# Patient Record
Sex: Female | Born: 1965 | Hispanic: Refuse to answer | Marital: Single
Health system: Midwestern US, Community
[De-identification: ages and names within clinical notes are randomized; demographics above are authoritative.]

---

## 2008-10-03 LAB — OB BLOOD BANK
ABO/Rh(D): A POS
ANTIBODY TITER: 1:2 {titer}
Antibody screen: POSITIVE

## 2008-10-04 LAB — CULTURE, URINE
Culture result:: NO GROWTH
Culture: NO GROWTH

## 2008-10-04 LAB — OB PROFILE 6 BSHR
ABS. EOSINOPHILS: 0 10*3/uL (ref 0.0–0.4)
ABS. LYMPHOCYTES: 2.3 10*3/uL (ref 0.8–3.5)
ABS. MONOCYTES: 0.6 10*3/uL (ref 0–1.0)
ABS. NEUTROPHILS: 4.8 10*3/uL (ref 1.8–8.0)
BASOPHILS: 0 % (ref 0–3)
EOSINOPHILS: 0 % (ref 0–5)
HCT: 33.2 % — ABNORMAL LOW (ref 36.0–46.0)
HGB: 11.2 g/dL — ABNORMAL LOW (ref 12.0–16.0)
HIV 1/2 Ab screen: NEGATIVE
Hep B surface Ag Interp.: NEGATIVE
Hepatitis B surface Ag: <0.1 Index (ref <1.00)
LYMPHOCYTES: 30 % (ref 20–51)
MCH: 30.1 PG (ref 25.0–35.0)
MCHC: 33.7 g/dL (ref 31.0–37.0)
MCV: 89.2 FL (ref 78.0–102.0)
MONOCYTES: 7 % (ref 2–9)
MPV: 7.1 FL — ABNORMAL LOW (ref 7.4–10.4)
NEUTROPHILS: 63 % (ref 42–75)
PLATELET: 359 10*3/uL (ref 130–400)
RBC: 3.72 M/uL — ABNORMAL LOW (ref 4.10–5.10)
RDW: 16.6 % — ABNORMAL HIGH (ref 11.5–14.5)
RPR: NONREACTIVE
Rubella IgG, QL: IMMUNE
WBC: 7.7 10*3/uL (ref 4.5–13.0)

## 2008-10-04 LAB — VITAMIN D, 25 HYDROXY: Vitamin D 25-Hydroxy: 54 ng/mL (ref 30–80)

## 2009-03-19 LAB — CULTURE, GENITAL GROUP B STREP: Culture result:: NEGATIVE

## 2009-04-09 LAB — CBC WITH AUTOMATED DIFF
ABS. EOSINOPHILS: 0 10*3/uL (ref 0.0–0.4)
ABS. LYMPHOCYTES: 1.7 10*3/uL (ref 0.8–3.5)
ABS. MONOCYTES: 0.7 10*3/uL (ref 0–1.0)
ABS. NEUTROPHILS: 10.9 10*3/uL — ABNORMAL HIGH (ref 1.8–8.0)
BASOPHILS: 0 % (ref 0–3)
EOSINOPHILS: 0 % (ref 0–5)
HCT: 33 % — ABNORMAL LOW (ref 36.0–46.0)
HGB: 11.2 g/dL — ABNORMAL LOW (ref 12.0–16.0)
LYMPHOCYTES: 12 % — ABNORMAL LOW (ref 20–51)
MCH: 30.5 PG (ref 25.0–35.0)
MCHC: 33.8 g/dL (ref 31.0–37.0)
MCV: 90 FL (ref 78.0–102.0)
MONOCYTES: 6 % (ref 2–9)
MPV: 7.3 FL — ABNORMAL LOW (ref 7.4–10.4)
NEUTROPHILS: 82 % — ABNORMAL HIGH (ref 42–75)
PLATELET: 280 10*3/uL (ref 130–400)
RBC: 3.67 M/uL — ABNORMAL LOW (ref 4.10–5.10)
RDW: 17 % — ABNORMAL HIGH (ref 11.5–14.5)
WBC: 13.3 10*3/uL — ABNORMAL HIGH (ref 4.5–13.0)

## 2009-04-10 LAB — CBC WITH AUTOMATED DIFF
ABS. EOSINOPHILS: 0 10*3/uL (ref 0.0–0.4)
ABS. LYMPHOCYTES: 1.3 10*3/uL (ref 0.8–3.5)
ABS. MONOCYTES: 0.6 10*3/uL (ref 0–1.0)
ABS. NEUTROPHILS: 14.1 10*3/uL — ABNORMAL HIGH (ref 1.8–8.0)
BASOPHILS: 0 % (ref 0–3)
EOSINOPHILS: 0 % (ref 0–5)
HCT: 26.2 % — ABNORMAL LOW (ref 36.0–46.0)
HGB: 8.8 g/dL — ABNORMAL LOW (ref 12.0–16.0)
LYMPHOCYTES: 8 % — ABNORMAL LOW (ref 20–51)
MCH: 30.3 PG (ref 25.0–35.0)
MCHC: 33.7 g/dL (ref 31.0–37.0)
MCV: 90.1 FL (ref 78.0–102.0)
MONOCYTES: 4 % (ref 2–9)
MPV: 7.3 FL — ABNORMAL LOW (ref 7.4–10.4)
NEUTROPHILS: 88 % — ABNORMAL HIGH (ref 42–75)
PLATELET: 238 10*3/uL (ref 130–400)
RBC: 2.91 M/uL — ABNORMAL LOW (ref 4.10–5.10)
RDW: 16.7 % — ABNORMAL HIGH (ref 11.5–14.5)
WBC: 16 10*3/uL — ABNORMAL HIGH (ref 4.5–13.0)

## 2009-04-13 LAB — TYPE & SCREEN
ABO/Rh(D): A POS
ANTIGEN/ANTIBODY INFO: NEGATIVE
ANTIGEN/ANTIBODY INFO: NEGATIVE
Antibody screen: POSITIVE

## 2009-04-13 LAB — TYPE AND SCREEN
ABO/Rh: A POS
Antibody Screen: POSITIVE
Antigen/Antibody: NEGATIVE
Antigen/Antibody: NEGATIVE

## 2009-05-30 LAB — CHLAMYDIA/GC PCR
Chlamydia amplified: NEGATIVE
N. gonorrhea, amplified: NEGATIVE

## 2009-06-15 NOTE — Discharge Summary (Unsigned)
[facility]   [address], [place] [address]     DISCHARGE SUMMARY    PATIENT: Sandra Mays, Sandra Mays  MRN: [id number] ADMITTED: 04/09/2009  BILLING: [id number] DISCHARGED: 04/12/2009  ATTENDING: Karolee Stamps, MD  DICTATING: Karolee Stamps, MD    ADMITTING DIAGNOSIS: Previous cesarean section, at term, in labor.    HISTORY: This is a 43 year old female, gravida 4, para 1-0-2-1 at 39-1/2  weeks', [facility] 04/15/2009, who was admitted by Dr. Kathlene November for early labor. The  patient and Dr. Kathlene November had decided that she would undergo a trial of  vaginal birth after cesarean section. Her physical exam on admission was  significant for the fact that she was 4 cm dilated, 70% effaced with the  fetal head at 0 station. Fetal heart tones were in the 140s. (For details  of the physical exam, please refer to the history and physical in the  chart.) Her prenatal course up to that point had been uneventful. She was  admitted on 04/09/09, in active labor.    HOSPITAL COURSE: The patient continued to labor over the next 10 hours  with Pitocin augmentation and artificial rupture of membranes. She was  given an epidural. At 2135, she started having some repetitive late  decelerations. Dr. Kathlene November contended at that time that the patient's  nonreassuring fetal heart rate warranted a repeat cesarean section. She was  taken to surgery on 04/09/09, and underwent a repeat cesarean section for a  viable female infant with Apgars of 8 and 9. Postoperatively, she did very  well. She did not incur any significant complication. She was mildly anemic  with hemoglobin and hematocrit 8.8 and 26.2. She was placed on iron. She  was discharged home in stable condition on 04/12/09.    DISCHARGE ORDERS  1. Regular diet.  2. Activity: No sex, douching, tampons, strenuous activity for 4 weeks.    MEDICATIONS  1. Iron sulfate twice a day.  2. Percocet 5/325 two tablets every 4 to 6 hours p.r.n.     FOLLOWUP: Supposed to be with Dr. Kathlene November in 1 week for incision check.    FINAL DIAGNOSES IN THIS PATIENT  1. Non-reassuring fetal heart rate at term.  2. Failed vaginal birth after cesarean section.         Preliminary   Karolee Stamps, MD    EWL:wmx  D: 06/15/2009 2:50 P T: 06/15/2009 8:42 P  Job #: [id number] CScriptDoc #: [id number]  cc: Karolee Stamps, MD

## 2010-08-12 NOTE — Progress Notes (Signed)
Subjective:   44 y.o. female for annual routine Pap and checkup.  Patient's last menstrual period was 07/26/2010.    Social History: single partner, contraception - none.  Pertinent past medical hstory: no history of HTN, DVT, CAD, DM, liver disease, migraines or smoking.    There is no problem list on file for this patient.       ROS:  Feeling well. No dyspnea or chest pain on exertion.  No abdominal pain, change in bowel habits, black or bloody stools.  No urinary tract symptoms. GYN ROS: normal menses, no abnormal bleeding, pelvic pain or discharge, no breast pain or new or enlarging lumps on self exam, she complains of mild discomfort with her last cycle. No neurological complaints.    She also had a +UPT and missed menses.  She has since had a cycle and desires a pregnancy.  Objective:   BP 103/67   Pulse 87   Resp 18   Ht 5\' 5"  (1.651 m)   Wt 126 lb (57.153 kg)   BMI 20.97 kg/m2   LMP 07/26/2010   Breastfeeding? No  The patient appears well, alert, oriented x 3, in no distress.  ENT normal.  Neck supple. No adenopathy or thyromegaly. PERLA. Lungs are clear, good air entry, no wheezes, rhonchi or rales. S1 and S2 normal, no murmurs, regular rate and rhythm. Abdomen soft without tenderness, guarding, mass or organomegaly. Extremities show no edema, normal peripheral pulses. Neurological is normal, no focal findings.    BREAST EXAM: breasts appear normal, no suspicious masses, no skin or nipple changes or axillary nodes, bilateral implants, no palpable abnormalities otherwise    PELVIC EXAM: normal external genitalia, vulva, vagina, cervix, uterus and adnexa, VULVA: normal appearing vulva with no masses, tenderness or lesions, VAGINA: normal appearing vagina with normal color and discharge, no lesions, CERVIX: normal appearing cervix without discharge or lesions, UTERUS: uterus is normal size, shape, consistency and nontender, ADNEXA: normal adnexa in size, nontender and no masses    Assessment/Plan:    well woman  Mammogram scheduled today.  pap smear  counseled on breast self exam, mammography screening, family planning choices and adequate intake of calcium and vitamin D  return annually or prn  1. Routine gynecological examination (V72.31)  PAP, IG, RFX HPV ASCUS ([id number])     .

## 2010-08-13 NOTE — Progress Notes (Addendum)
Addended by: GILLIS, TYESHA S on: 08/13/2010      Modules accepted: Level of Service

## 2010-09-03 NOTE — Progress Notes (Signed)
Quick Note:    Patient needs Pap in 1 year.  ______

## 2013-01-10 IMAGING — DX FOOT 3 VIEWS LEFT
1 series · 3 of 3 positions shown · non-contrast
Comparison: none

LEFT FOOT, 01/10/13:
Three views of the left foot from 01/10/13 were obtained in this 47-year-
old with pain in the MP joints.
Bone density is normal.  There is no fracture or dislocation. The joint
spaces are well preserved.

[Series 1: AP · U · 0.14mm/px · 3 of 3 slices shown]
[im 1/3]
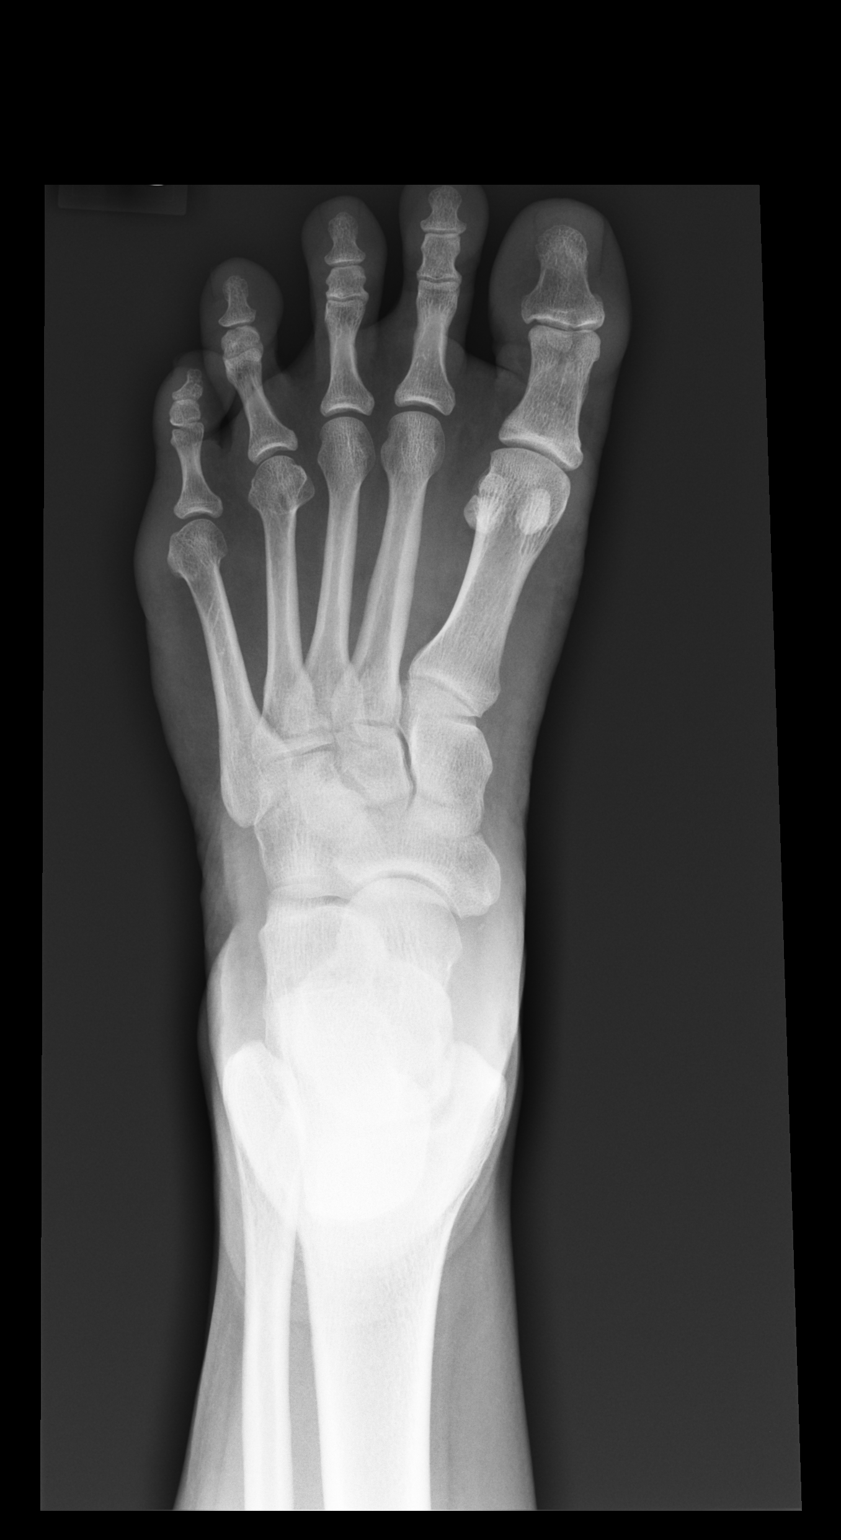
[im 2/3]
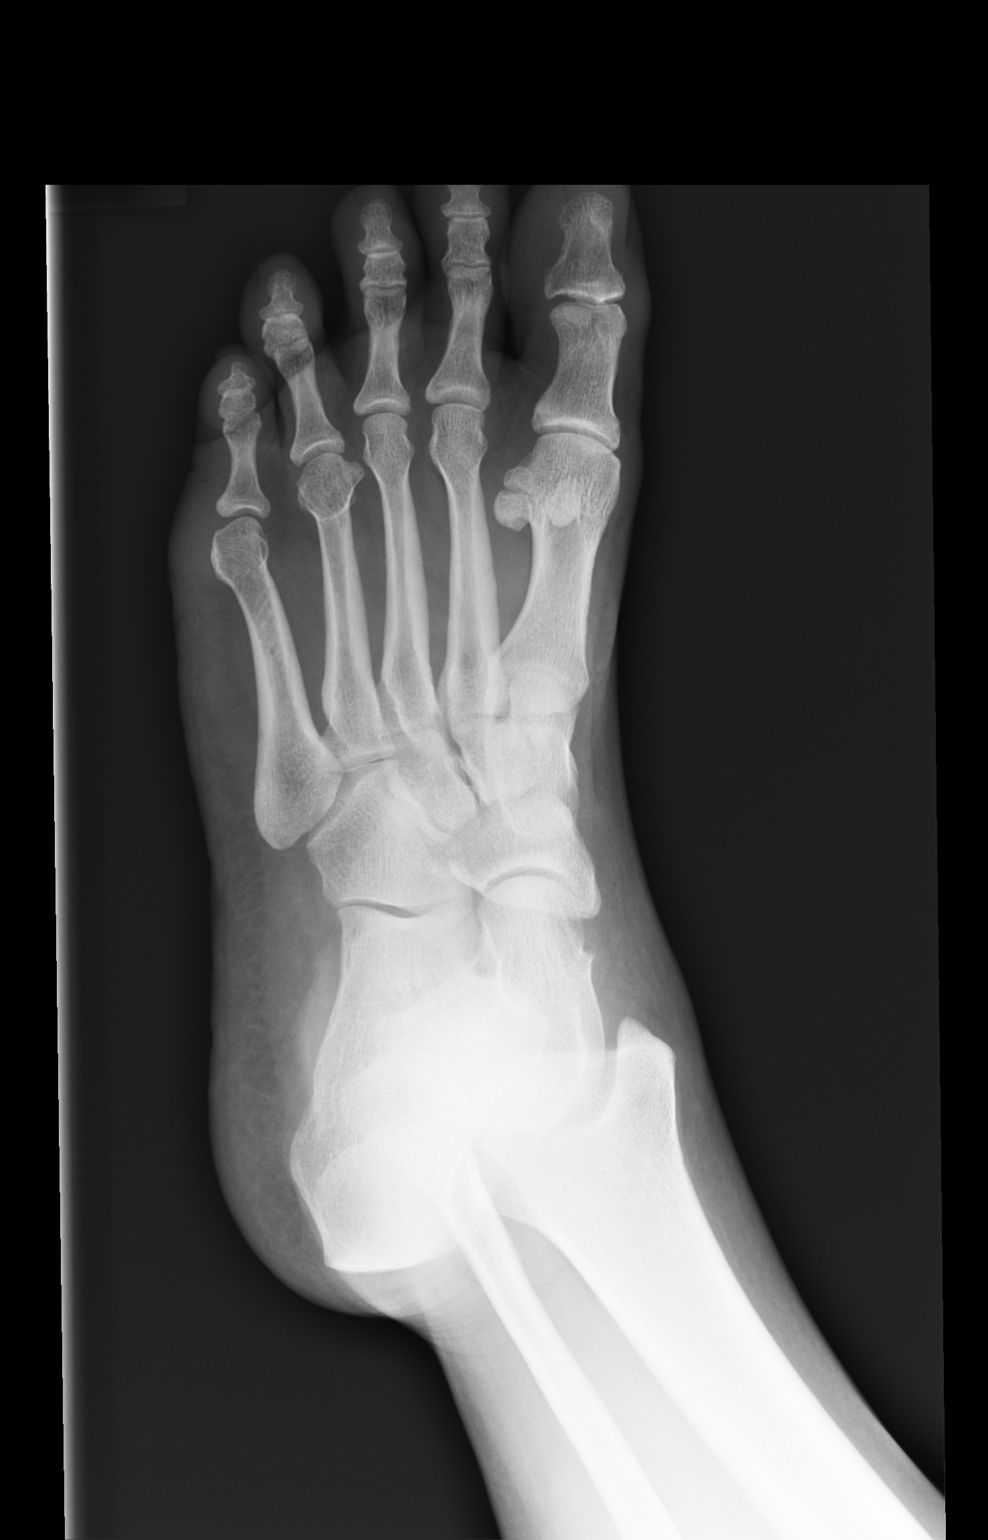
[im 3/3]
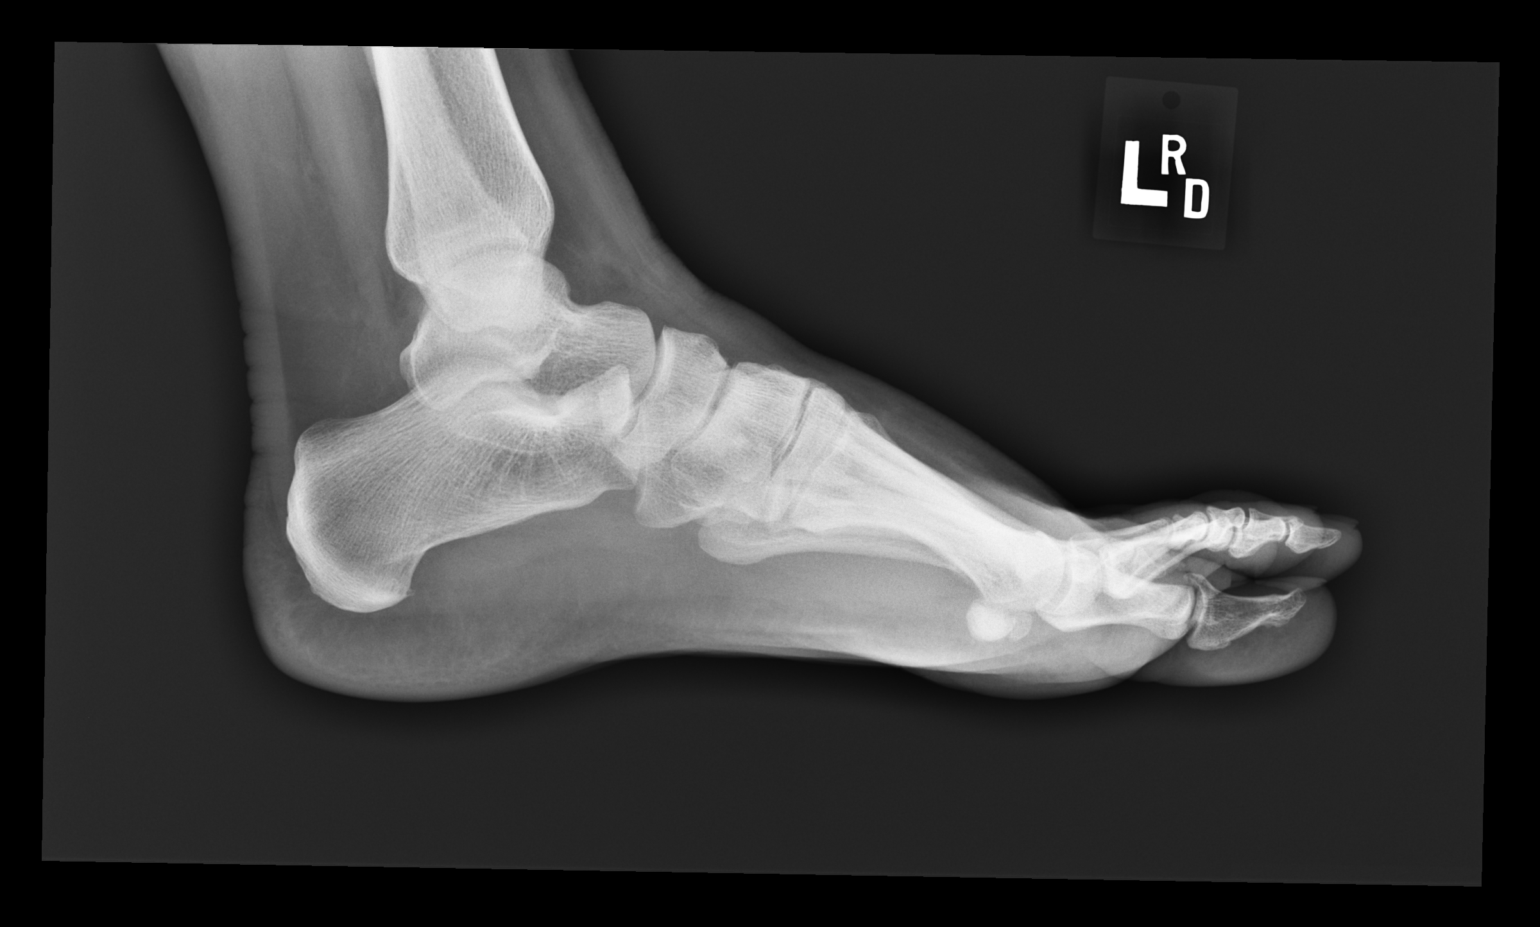

[3 of 3 positions shown; findings below may reference images not displayed]

IMPRESSION: Negative left foot films.

## 2015-01-02 ENCOUNTER — Ambulatory Visit
Admit: 2015-01-02 | Discharge: 2015-01-02 | Payer: PRIVATE HEALTH INSURANCE | Attending: Obstetrics & Gynecology | Primary: Internal Medicine

## 2015-01-02 ENCOUNTER — Inpatient Hospital Stay: Admit: 2015-01-02 | Payer: BLUE CROSS/BLUE SHIELD | Primary: Internal Medicine

## 2015-01-02 DIAGNOSIS — Z01419 Encounter for gynecological examination (general) (routine) without abnormal findings: Secondary | ICD-10-CM

## 2015-01-02 LAB — AMB POC URINE PREGNANCY TEST, VISUAL COLOR COMPARISON: HCG urine, Ql. (POC): NEGATIVE

## 2015-01-02 NOTE — Progress Notes (Signed)
Subjective:   49 y.o. female for annual routine Pap and checkup. She also c/o bilateral upper abdominal pain that is often sharp, pinpoint and not associated with excessive exercise, menses, dyspareunia, or any other GYN symptom. She is concerned it may be scar tissue from her previous c-section.  Patient's last menstrual period was 11/30/2014 (exact date).    Social History: single partner, contraception - none.  Pertinent past medical hstory: no history of HTN, DVT, CAD, DM, liver disease, migraines or smoking.    There is no problem list on file for this patient.      No Known Allergies  Past Medical History   Diagnosis Date   ??? AMA (advanced maternal age) multigravida 30+    ??? Fibroids      Past Surgical History   Procedure Laterality Date   ??? Hx gyn  04/09/2009     C-section     History   Substance Use Topics   ??? Smoking status: Never Smoker    ??? Smokeless tobacco: Never Used   ??? Alcohol Use: Yes      Comment: social        ROS:  Feeling well. No dyspnea or chest pain on exertion.  No abdominal pain, change in bowel habits, black or bloody stools.  No urinary tract symptoms. GYN ROS: normal menses, no abnormal bleeding, pelvic pain or discharge, no breast pain or new or enlarging lumps on self exam. No neurological complaints.    Objective:   BP 109/78 mmHg   Pulse 85   Ht 5\' 5"  (1.651 m)   Wt 140 lb (63.504 kg)   BMI 23.30 kg/m2   LMP 11/30/2014 (Exact Date)  The patient appears well, alert, oriented x 3, in no distress.  ENT normal.  Neck supple. No adenopathy or thyromegaly. PERLA. Lungs are clear, good air entry, no wheezes, rhonchi or rales. S1 and S2 normal, no murmurs, regular rate and rhythm. Abdomen soft without tenderness, guarding, mass or organomegaly. Extremities show no edema, normal peripheral pulses. Neurological is normal, no focal findings.    BREAST EXAM: bilateral implants, no palpable abnormalities otherwise    PELVIC EXAM: normal external genitalia, vulva, vagina, cervix, uterus and  adnexa    Assessment/Plan:   well woman with upper abdominal pain, not seemingly GYN in nature.  Recommend work-up with PCP if continues.  Pt. Has concern for previously diagnosed fibroids- will be reassessed with ultrasound.  mammogram  pap smear  return annually or prn    ICD-10-CM ICD-9-CM    1. Well woman exam with routine gynecological exam Z01.419 V72.31 PAP IG, RFX HPV ASCU, 10&17,51([id number])   2. Visit for screening mammogram Z12.31 V76.12 MAM MAMMO BI SCREENING DIGTL   3. Menorrhagia with irregular cycle N92.1 626.2 [place] PELV NON OB W TV      AMB POC URINE PREGNANCY TEST, VISUAL COLOR COMPARISON   4. Encounter to establish care Z71.89 V65.8 REFERRAL TO INTERNAL MEDICINE   The plan of care has been discussed with the patient.  She has been given the opportunity to ask questions, which seem to have been answered satisfactorily.

## 2015-01-02 NOTE — Patient Instructions (Signed)
Abdominal Pain: Care Instructions  Your Care Instructions     Abdominal pain has many possible causes. Some aren't serious and get better on their own in a few days. Others need more testing and treatment. If your pain continues or gets worse, you need to be rechecked and may need more tests to find out what is wrong. You may need surgery to correct the problem.  Don't ignore new symptoms, such as fever, nausea and vomiting, urination problems, pain that gets worse, and dizziness. These may be signs of a more serious problem.  Your doctor may have recommended a follow-up visit in the next 8 to 12 hours. If you are not getting better, you may need more tests or treatment.  The doctor has checked you carefully, but problems can develop later. If you notice any problems or new symptoms, get medical treatment right away.  Follow-up care is a key part of your treatment and safety. Be sure to make and go to all appointments, and call your doctor if you are having problems. It's also a good idea to know your test results and keep a list of the medicines you take.  How can you care for yourself at home?  ?? Rest until you feel better.  ?? To prevent dehydration, drink plenty of fluids, enough so that your urine is light yellow or clear like water. Choose water and other caffeine-free clear liquids until you feel better. If you have kidney, heart, or liver disease and have to limit fluids, talk with your doctor before you increase the amount of fluids you drink.  ?? If your stomach is upset, eat mild foods, such as rice, dry toast or crackers, bananas, and applesauce. Try eating several small meals instead of two or three large ones.  ?? Wait until 48 hours after all symptoms have gone away before you have spicy foods, alcohol, and drinks that contain caffeine.  ?? Do not eat foods that are high in fat.  ?? Avoid anti-inflammatory medicines such as aspirin, ibuprofen (Advil,  Motrin), and naproxen (Aleve). These can cause stomach upset. Talk to your doctor if you take daily aspirin for another health problem.  When should you call for help?  Call 911 anytime you think you may need emergency care. For example, call if:  ?? You passed out (lost consciousness).  ?? You pass maroon or very bloody stools.  ?? You vomit blood or what looks like coffee grounds.  ?? You have new, severe belly pain.  Call your doctor now or seek immediate medical care if:  ?? Your pain gets worse, especially if it becomes focused in one area of your belly.  ?? You have a new or higher fever.  ?? Your stools are black and look like tar, or they have streaks of blood.  ?? You have unexpected vaginal bleeding.  ?? You have symptoms of a urinary tract infection. These may include:  ?? Pain when you urinate.  ?? Urinating more often than usual.  ?? Blood in your urine.  ?? You are dizzy or lightheaded, or you feel like you may faint.  Watch closely for changes in your health, and be sure to contact your doctor if:  ?? You are not getting better after 1 day (24 hours).   Where can you learn more?   Go to http://www.healthwise.net/[facility]  Enter E907 in the search box to learn more about "Abdominal Pain: Care Instructions."   ?? 2006-2015 Healthwise, Incorporated. Care instructions adapted under   license by [place] (which disclaims liability or warranty for this information). This care instruction is for use with your licensed healthcare professional. If you have questions about a medical condition or this instruction, always ask your healthcare professional. Healthwise, Incorporated disclaims any warranty or liability for your use of this information.  Content Version: 10.7.482551; Current as of: Feb 17, 2014

## 2015-01-02 NOTE — Progress Notes (Signed)
p-test = negative

## 2015-01-04 NOTE — Progress Notes (Signed)
Quick Note:        Normal pap.    Letter please    ______

## 2015-01-08 NOTE — Progress Notes (Signed)
Pap letter sent.

## 2015-01-16 ENCOUNTER — Inpatient Hospital Stay: Admit: 2015-01-16 | Payer: BLUE CROSS/BLUE SHIELD | Attending: Obstetrics & Gynecology | Primary: Internal Medicine

## 2015-01-16 DIAGNOSIS — N921 Excessive and frequent menstruation with irregular cycle: Secondary | ICD-10-CM

## 2015-01-16 DIAGNOSIS — Z1231 Encounter for screening mammogram for malignant neoplasm of breast: Secondary | ICD-10-CM

## 2015-01-18 ENCOUNTER — Inpatient Hospital Stay: Admit: 2015-01-18 | Payer: BLUE CROSS/BLUE SHIELD | Primary: Internal Medicine

## 2015-01-18 ENCOUNTER — Ambulatory Visit
Admit: 2015-01-18 | Discharge: 2015-01-18 | Payer: PRIVATE HEALTH INSURANCE | Attending: Internal Medicine | Primary: Internal Medicine

## 2015-01-18 DIAGNOSIS — R1012 Left upper quadrant pain: Secondary | ICD-10-CM

## 2015-01-18 DIAGNOSIS — R140 Abdominal distension (gaseous): Secondary | ICD-10-CM

## 2015-01-18 LAB — METABOLIC PANEL, COMPREHENSIVE
A-G Ratio: 1.1 (ref 0.8–1.7)
ALT (SGPT): 34 U/L (ref 13–56)
AST (SGOT): 23 U/L (ref 15–37)
Albumin: 4.2 g/dL (ref 3.4–5.0)
Alk. phosphatase: 45 U/L (ref 45–117)
Anion gap: 11 mmol/L (ref 3.0–18)
BUN/Creatinine ratio: 10 — ABNORMAL LOW (ref 12–20)
BUN: 7 MG/DL (ref 7.0–18)
Bilirubin, total: 0.3 MG/DL (ref 0.2–1.0)
CO2: 26 mmol/L (ref 21–32)
Calcium: 8.5 MG/DL (ref 8.5–10.1)
Chloride: 104 mmol/L (ref 100–108)
Creatinine: 0.71 MG/DL (ref 0.6–1.3)
GFR est AA: >60 mL/min/{1.73_m2} (ref >60)
GFR est non-AA: >60 mL/min/{1.73_m2} (ref >60)
Globulin: 3.8 g/dL (ref 2.0–4.0)
Glucose: 87 mg/dL (ref 74–99)
Potassium: 4.3 mmol/L (ref 3.5–5.5)
Protein, total: 8 g/dL (ref 6.4–8.2)
Sodium: 141 mmol/L (ref 136–145)

## 2015-01-18 LAB — CBC W/O DIFF
HCT: 24.4 % — ABNORMAL LOW (ref 35.0–45.0)
HGB: 6.8 g/dL — ABNORMAL LOW (ref 12.0–16.0)
MCH: 19.2 PG — ABNORMAL LOW (ref 24.0–34.0)
MCHC: 27.9 g/dL — ABNORMAL LOW (ref 31.0–37.0)
MCV: 68.7 FL — ABNORMAL LOW (ref 74.0–97.0)
MPV: 9.1 FL — ABNORMAL LOW (ref 9.2–11.8)
PLATELET: 395 10*3/uL (ref 135–420)
RBC: 3.55 M/uL — ABNORMAL LOW (ref 4.20–5.30)
RDW: 20.7 % — ABNORMAL HIGH (ref 11.6–14.5)
WBC: 4.8 10*3/uL (ref 4.6–13.2)

## 2015-01-18 LAB — LIPID PANEL
CHOL/HDL Ratio: 3.6 (ref 0–5.0)
Cholesterol, total: 196 MG/DL (ref <200)
HDL Cholesterol: 55 MG/DL (ref 40–60)
LDL, calculated: 124.8 MG/DL — ABNORMAL HIGH (ref 0–100)
Triglyceride: 81 MG/DL (ref <150)
VLDL, calculated: 16.2 MG/DL

## 2015-01-18 LAB — IRON PROFILE
Iron % saturation: 3 %
Iron: 16 ug/dL — ABNORMAL LOW (ref 50–175)
TIBC: 533 ug/dL — ABNORMAL HIGH (ref 250–450)

## 2015-01-18 LAB — FERRITIN: Ferritin: 3 NG/ML — ABNORMAL LOW (ref 8–388)

## 2015-01-18 NOTE — Progress Notes (Signed)
Sandra Mays is a 49 y.o. female  Patient here to establish care. Patient with concerns with abdominal bloating.          1. Have you been to the ER, urgent care clinic since your last visit?  Hospitalized since your last visit?No    2. Have you seen or consulted any other health care providers outside of the [place] since your last visit?  Include any pap smears or colon screening. No

## 2015-01-18 NOTE — Progress Notes (Signed)
Assessment/Plan:    Adaly was seen today for establish care.    Diagnoses and all orders for this visit:    LUQ pain and abd bloating- discussed need for GI eval.  It most likely is related to GI, although doesn't have clear association with eating or with bm.  If GI w/u is neg, will get CT abd/pelvis.  Orders:  -     METABOLIC PANEL, COMPREHENSIVE; Future  -     TISSUE TRANSGLUT. AB, IGG; Future    Anemia, unspecified type- likely from menorrhagia/fibroids.  Orders:  -     CBC W/O DIFF; Future  -     IRON PROFILE; Future  -     FERRITIN; Future    Screening for cardiovascular condition  Orders:  -     LIPID PANEL; Future        The plan was discussed with the patient.  The patient verbalized understanding and is in agreement with the plan.  All medication potential side effects were discussed with the patient.  Health Maintenance:   Health Maintenance   Topic Date Due   ??? Tdap Age > 18  12/11/1984   ??? Td Q 10 Yrs Age > 18  12/11/1984   ??? INFLUENZA AGE 19 TO ADULT  04/29/2014   ??? PAP AKA CERVICAL CYTOLOGY  01/01/2018       Chiquita Heckert is a 49 y.o.  female and presents with Establish Care     Subjective:  Pt is here to establish care.  Pt notes some RUQ pain that started several years ago.  It moved to LUQ pain.  States it's tender to touch.  Notes increased bloating.  Had only one episode of vomiting that happened 1-2 mos ago.  This was associated with diarrhea.  This lasted a week.  But notes her abd pain resolved after this episode b/c she was "cleared out".  The pain is only when she presses on her abdomen.  Not associated with eating. Notes a change in her bm over the past month - currently her bm are one piece, well formed, every other day, easy to pass.  Can't tell me if pain improves after a bm.  Has been tested for celiac and lactose allergy in the past, and it was neg.  Doesn't relate any issues with dietary triggers like gluten.    Notes a h/o anemia.  Has h/o fibroids.  Has had syncopal episodes that she  attributes to anemia.  Notes DOE from anemia.    ROS:  Constitutional: No recent weight change. No weakness/fatigue.  No f/c.   Skin: No rashes, change in nails/hair, itching   HENT: No HA, dizziness. No hearing loss/tinnitus.  No nasal congestion/discharge.   Eyes: No change in vision, double/blurred vision or eye pain/redness.    Cardiovascular: No CP/palpitations.  No DOE/orthopnea/PND.   Respiratory: No cough/sputum, dyspnea, wheezing.   Gastointestinal: No dysphagia, reflux.  No n/v.  No constipation/diarrhea.  No melena/rectal bleeding.   Genitourinary: No dysuria, urinary hesitancy, nocturia, hematuria.  No incontinence.   Musculoskeletal: No joint pain/stiffness.  No muscle pain/tenderness.   Endo: No heat/cold intolerance, no polyuria/polydypsia.   Heme: + h/o anemia.  No easy bleeding/bruising.   Allergy/Immunology: No seasonal rhinitis. Denies frequent colds, sinus/ear infections.   Neurological: No seizures/numbness/weakness.  No paresthesias.   Psychiatric:  No depression, anxiety.   PMH:  Past Medical History   Diagnosis Date   ??? AMA (advanced maternal age) multigravida 61+    ???  Fibroids        PSH:  Past Surgical History   Procedure Laterality Date   ??? Hx gyn  04/09/2009     C-section   ??? Implant breast silicone/eq Bilateral      saline implants-2004        SH:  History   Substance Use Topics   ??? Smoking status: Never Smoker    ??? Smokeless tobacco: Never Used   ??? Alcohol Use: Yes      Comment: social       FH:  Family History   Problem Relation Age of Onset   ??? Diabetes Mother 35   ??? Diabetes Father 60   ??? Diabetes Sister    ??? Cataract Sister    ??? Cancer Paternal Aunt 42     Breast   ??? Breast Cancer Paternal Aunt 41     Medications/Allergies:  No current outpatient prescriptions on file.  No Known Allergies    Objective:  BP 100/62 mmHg   Pulse 81   Temp(Src) 98.2 ??F (36.8 ??C) (Oral)   Resp 20   Ht 5' 4.5" (1.638 m)   Wt 139 lb (63.05 kg)   BMI 23.50 kg/m2   SpO2 99%   LMP 01/07/2015    Constitutional: Well developed, nourished, no distress, alert   HENT: Exterior ears and tympanic membranes normal bilaterally. Supple neck. No thyromegaly or lymphadenopathy. Oropharynx clear and moist mucous membranes.     Eyes: Conjunctiva normal. PERRL.    Cardiovascular: S1, S2.  RRR.  No murmurs/rubs.  No thrills palpated.  No carotid bruits.  Intact distal pulses.  No edema.   Pulmonary/Chest Wall: No abnormalities on inspection.  Clear to auscultation bilaterally. No wheezing/rhonchi.  Normal effort.     GI: Soft, diffuse mild tenderness to palpation, no rebound or guarding, nondistended.  Normal active bowel sounds. No  masses on palpation.  No hepatosplenomegaly.   Musculoskeletal: Gait normal.  Joints without deformity/tenderness.     Neurological: Appropriate.  No focal motor or sensory deficits. Speech normal.   Psych: Appropriate affect, judgement and insight.  Short-term memory intact.

## 2015-01-18 NOTE — Patient Instructions (Signed)
Abdominal Pain: Care Instructions  Your Care Instructions     Abdominal pain has many possible causes. Some aren't serious and get better on their own in a few days. Others need more testing and treatment. If your pain continues or gets worse, you need to be rechecked and may need more tests to find out what is wrong. You may need surgery to correct the problem.  Don't ignore new symptoms, such as fever, nausea and vomiting, urination problems, pain that gets worse, and dizziness. These may be signs of a more serious problem.  Your doctor may have recommended a follow-up visit in the next 8 to 12 hours. If you are not getting better, you may need more tests or treatment.  The doctor has checked you carefully, but problems can develop later. If you notice any problems or new symptoms, get medical treatment right away.  Follow-up care is a key part of your treatment and safety. Be sure to make and go to all appointments, and call your doctor if you are having problems. It's also a good idea to know your test results and keep a list of the medicines you take.  How can you care for yourself at home?  ?? Rest until you feel better.  ?? To prevent dehydration, drink plenty of fluids, enough so that your urine is light yellow or clear like water. Choose water and other caffeine-free clear liquids until you feel better. If you have kidney, heart, or liver disease and have to limit fluids, talk with your doctor before you increase the amount of fluids you drink.  ?? If your stomach is upset, eat mild foods, such as rice, dry toast or crackers, bananas, and applesauce. Try eating several small meals instead of two or three large ones.  ?? Wait until 48 hours after all symptoms have gone away before you have spicy foods, alcohol, and drinks that contain caffeine.  ?? Do not eat foods that are high in fat.  ?? Avoid anti-inflammatory medicines such as aspirin, ibuprofen (Advil,  Motrin), and naproxen (Aleve). These can cause stomach upset. Talk to your doctor if you take daily aspirin for another health problem.  When should you call for help?  Call 911 anytime you think you may need emergency care. For example, call if:  ?? You passed out (lost consciousness).  ?? You pass maroon or very bloody stools.  ?? You vomit blood or what looks like coffee grounds.  ?? You have new, severe belly pain.  Call your doctor now or seek immediate medical care if:  ?? Your pain gets worse, especially if it becomes focused in one area of your belly.  ?? You have a new or higher fever.  ?? Your stools are black and look like tar, or they have streaks of blood.  ?? You have unexpected vaginal bleeding.  ?? You have symptoms of a urinary tract infection. These may include:  ?? Pain when you urinate.  ?? Urinating more often than usual.  ?? Blood in your urine.  ?? You are dizzy or lightheaded, or you feel like you may faint.  Watch closely for changes in your health, and be sure to contact your doctor if:  ?? You are not getting better after 1 day (24 hours).   Where can you learn more?   Go to http://www.healthwise.net/[facility]  Enter E907 in the search box to learn more about "Abdominal Pain: Care Instructions."   ?? 2006-2016 Healthwise, Incorporated. Care instructions adapted under   license by [place] (which disclaims liability or warranty for this information). This care instruction is for use with your licensed healthcare professional. If you have questions about a medical condition or this instruction, always ask your healthcare professional. Healthwise, Incorporated disclaims any warranty or liability for your use of this information.  Content Version: 10.8.513193; Current as of: Feb 17, 2014

## 2015-01-21 LAB — TISSUE TRANSGLUT. AB, IGG: t-Transglutaminase, IgG: 2 U/mL (ref 0–5)

## 2015-01-22 NOTE — Progress Notes (Signed)
Quick Note:        Tell pt her labs show that she has low iron and anemia (low blood count). I want her to start taking an OTC iron supplement twice daily. She should take colace (docusate) with it b/c it can cause constipation. I'd like to recheck her labs in 2-3 months.    ______

## 2015-01-23 ENCOUNTER — Ambulatory Visit: Admit: 2015-01-23 | Discharge: 2015-01-23 | Attending: Obstetrics & Gynecology | Primary: Internal Medicine

## 2015-01-23 DIAGNOSIS — D251 Intramural leiomyoma of uterus: Secondary | ICD-10-CM

## 2015-01-23 NOTE — Patient Instructions (Addendum)
Uterine Fibroids: Care Instructions  Your Care Instructions     Uterine fibroids are growths in the uterus. Fibroids aren't cancer. Doctors don't know what causes fibroids. Fibroids are very common in women during their childbearing years.  Fibroids can grow on the inside of the uterus, in the muscle wall of the uterus, or near the outside wall of the uterus. In some women, fibroids cause painful cramps and heavy periods. In these cases, taking anti-inflammatory medicines, birth control pills, or using an intrauterine device (IUD) often helps decrease symptoms. Sometimes surgery is needed to treat fibroids. But if you are near menopause, you may want to wait and see if your symptoms get better.  Most fibroids shrink and go away after menopause, when your menstrual periods stop completely.  Follow-up care is a key part of your treatment and safety. Be sure to make and go to all appointments, and call your doctor if you are having problems. It's also a good idea to know your test results and keep a list of the medicines you take.  How can you care for yourself at home?  ?? If your doctor gave you medicine, take it as exactly as prescribed. Call your doctor if you think you are having a problem with your medicine.  ?? Take anti-inflammatory medicines for pain. These include ibuprofen (Advil, Motrin) and naproxen (Aleve). Read and follow all instructions on the label.  ?? Use heat, such as a hot water bottle or a heating pad set on low, or a warm bath to relax tense muscles and relieve cramping. Put a thin cloth between the heating pad and your skin. Never go to sleep with a heating pad on.  ?? Lie down and put a pillow under your knees. Or, lie on your side and bring your knees up to your chest. These positions may help relieve belly pain or pressure.  ?? Keep track of how many sanitary pads or tampons you use each day.  ?? Get at least 30 minutes of exercise on most days of the week. Walking is  a good choice. You also may want to do other activities, such as running, swimming, cycling, or playing tennis or team sports.  ?? If you bleed longer than usual or have heavy bleeding, take a daily multivitamin with iron.  When should you call for help?  Call 911 anytime you think you may need emergency care. For example, call if:  ?? You passed out (lost consciousness).  ?? You have sudden, severe pain in your belly or pelvis.  Call your doctor now or seek immediate medical care if:  ?? You have severe vaginal bleeding. This means that you are soaking through your usual pads each hour for 2 or more hours.  ?? You have new belly or pelvic pain.  ?? You are dizzy or lightheaded, or you feel like you may faint.  ?? You have new or unexpected vaginal bleeding.  Watch closely for changes in your health, and be sure to contact your doctor if:  ?? You have new vaginal discharge.  ?? You have ongoing heavy or irregular vaginal bleeding.  ?? You have pelvic pain or a heavy feeling in your lower belly that doesn't go away.  ?? You have to urinate often.  ?? You are more constipated than usual.   Where can you learn more?   Go to [email or web address]  Enter B121 in the search box to learn more about "Uterine Fibroids: Care Instructions."   ?? 2006-2016  [employer]. Care instructions adapted under license by [employer] (which disclaims liability or warranty for this information). This care instruction is for use with your licensed healthcare professional. If you have questions about a medical condition or this instruction, always ask your healthcare professional. [place] any warranty or liability for your use of this information.  Content Version: 10.8.513193; Current as of: April 26, 2014        Endometrial Ablation: Before Your Procedure  What is endometrial ablation?     Endometrial ablation is a type of procedure that's often used to treat  heavy menstrual bleeding. It can also be used for other types of bleeding in the uterus. It's not recommended if you plan to get pregnant.  Ablation works by destroying the lining of your uterus. As it heals, the lining will scar. This scarring reduces or prevents bleeding.  Your doctor may give you medicine to help you relax. You may also get medicine to help with pain. First, your doctor inserts a special tool into your vagina. This is called a speculum. It gently spreads apart the sides of your vagina. Next, the doctor may put a lighted tube through your cervix. This is called a hysteroscope or scope. It helps the doctor see inside your uterus. Then the doctor inserts a device to destroy the lining. This device may work in one of many ways. It may use a laser beam, heat, electricity, freezing, or microwaves.  Ablation can be done in a doctor's office. Or it may be done in a hospital. It usually takes less than an hour. You can go home after the procedure.  Follow-up care is a key part of your treatment and safety. Be sure to make and go to all appointments, and call your doctor if you are having problems. It's also a good idea to know your test results and keep a list of the medicines you take.  What happens before the procedure?  Procedures can be stressful. This information will help you understand what you can expect. And it will help you safely prepare for your procedure.  Preparing for the procedure  ?? Understand exactly what procedure is planned, along with the risks, benefits, and other options.  ?? Tell your doctors ALL the medicines, vitamins, supplements, and herbal remedies you take. Some of these can increase the risk of bleeding or interact with anesthesia.  ?? If you take blood thinners, such as warfarin (Coumadin), clopidogrel (Plavix), or aspirin, be sure to talk to your doctor. He or she will tell you if you should stop taking these medicines before your procedure. Make  sure that you understand exactly what your doctor wants you to do.  ?? Your doctor will tell you which medicines to take or stop before your procedure. You may need to stop taking certain medicines a week or more before the procedure. So talk to your doctor as soon as you can.  ?? If you have an advance directive, let your doctor know. It may include a living will and a durable power of attorney for health care. Bring a copy to the hospital. If you don't have one, you may want to prepare one. It lets your doctor and loved ones know your health care wishes. Doctors advise that everyone prepare these papers before any type of surgery or procedure.  What happens on the day of the procedure?  ?? Follow the instructions exactly about when to stop eating and drinking. If you don't, your procedure  may be canceled. If your doctor told you to take your medicines on the day of the procedure, take them with only a sip of water.  ?? Take a bath or shower before you come in for your procedure. Do not apply lotions, perfumes, deodorants, or nail polish.  ?? Take off all jewelry and piercings. And take out contact lenses, if you wear them.  At the doctor's office or hospital  ?? Bring a picture ID.  ?? You will be kept comfortable and safe by your anesthesia provider. You may get medicine that relaxes you or puts you in a light sleep.  ?? The procedure will take less than an hour.  Going home  ?? Be sure you have someone to drive you home. Anesthesia and pain medicine make it unsafe for you to drive.  ?? You will be given more specific instructions about recovering from your procedure. They will cover things like diet, wound care, follow-up care, driving, and getting back to your normal routine.  When should you call your doctor?  ?? You have questions or concerns.  ?? You do not understand how to prepare for your procedure.  ?? You become ill before the procedure (such as fever, flu, or a cold).   ?? You need to reschedule or have changed your mind about having the procedure.   Where can you learn more?   Go to [email or web address]  Enter U581 in the search box to learn more about "Endometrial Ablation: Before Your Procedure."   ?? 2006-2016 [employer]. Care instructions adapted under license by [employer] (which disclaims liability or warranty for this information). This care instruction is for use with your licensed healthcare professional. If you have questions about a medical condition or this instruction, always ask your healthcare professional. [place] any warranty or liability for your use of this information.  Content Version: 10.8.513193; Current as of: November 18, 2013

## 2015-01-23 NOTE — Progress Notes (Signed)
49 y/o with menorrhagia and anemia presents to the office for follow-up. She had an annual and was sent for ultrasound.  She notes her last cycle was not as heavy, but she continues to feel drained and tired.  ROS: as above  BP 106/70 mmHg   Pulse 89   Ht 5' 4.5" (1.638 m)   Wt 139 lb (63.05 kg)   BMI 23.50 kg/m2   LMP 01/07/2015  Exam deferred    Ultrasound: 1.6 cm fibroid- intramural; small physiologic cyst on left. Right ovary not visualized    A/P:  Small fibroid- menorrhagia. Pt. Has started acupuncture for anemia.  Would like to use for several months prior to Western therapy.  Info given for ablation.    RTO prn.

## 2015-01-24 NOTE — Progress Notes (Signed)
Quick Note:        Left message for pt to call the office.    ______

## 2015-01-24 NOTE — Progress Notes (Signed)
Quick Note:        Pt notified.    ______

## 2020-07-16 IMAGING — MR MRI LUMBAR SPINE WITHOUT CONTRAST
4 of 7 series · 21 of 48 positions shown · non-contrast
Comparison: None

MRI LUMBAR SPINE WITHOUT CONTRAST, 07/16/2020 [DATE]: 
CLINICAL INDICATION: Chronic right hip pain radiating down leg. No injury. Right 
leg pain and numbness with low back pain.
TECHNIQUE: Multiplanar, multiecho position MR images of the lumbar spine were 
performed without contrast.

[Series 101: survey · axial · 10.0mm · 1.39mm/px · z∈[-33,+201]mm · 4 of 10 slices shown]
[im 1/10]
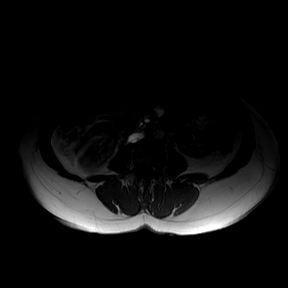
[im 4/10]
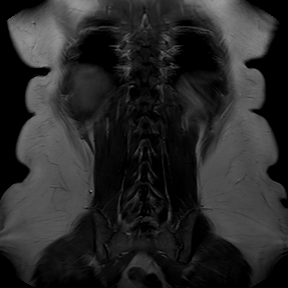
[im 7/10]
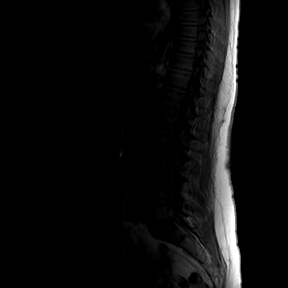
[im 10/10]
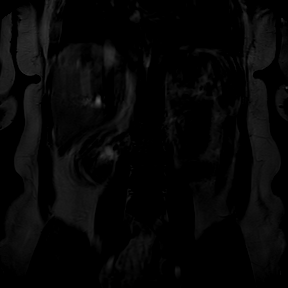

[Series 201: t2w_cor-surv · coronal · 6.0mm · 0.60mm/px · 2 of 5 slices shown]
[im 1/5]
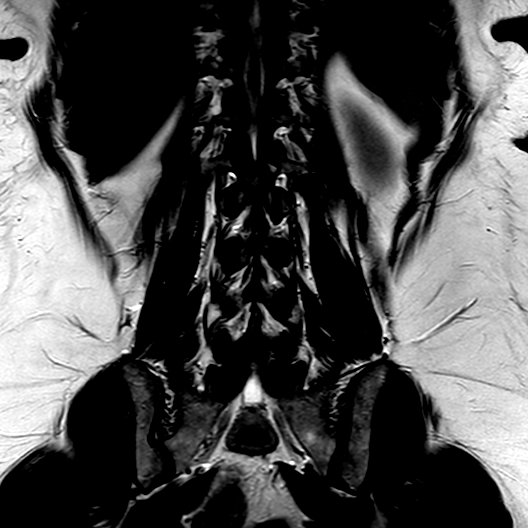
[im 5/5]
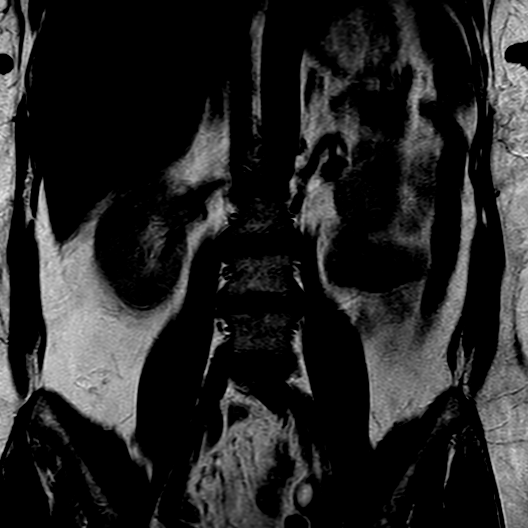

[Series 301: t1_tse_sag · sagittal · 4.0mm · 0.48mm/px · 6 of 17 slices shown]
[im 1/17]
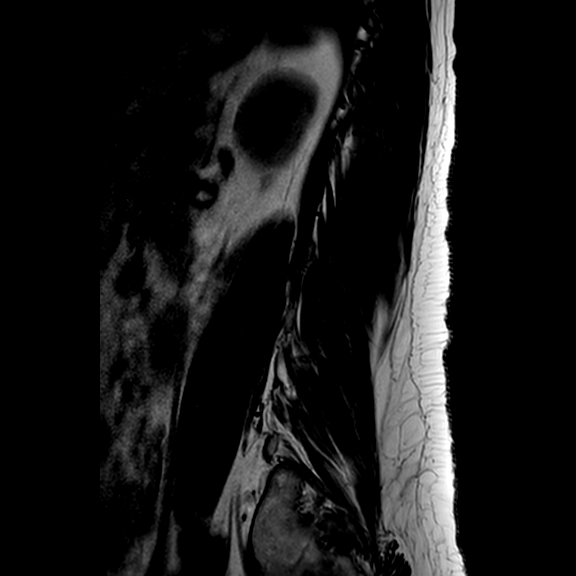
[im 4/17]
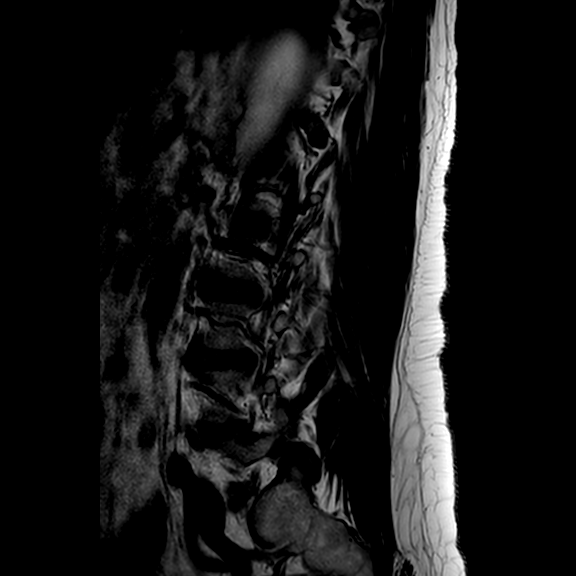
[im 7/17]
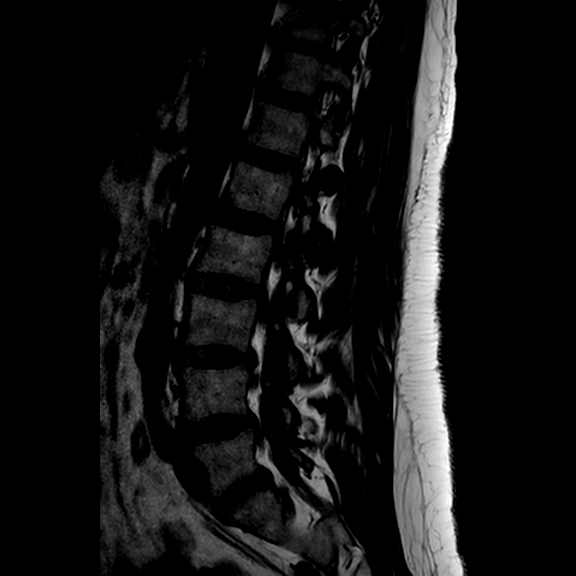
[im 10/17]
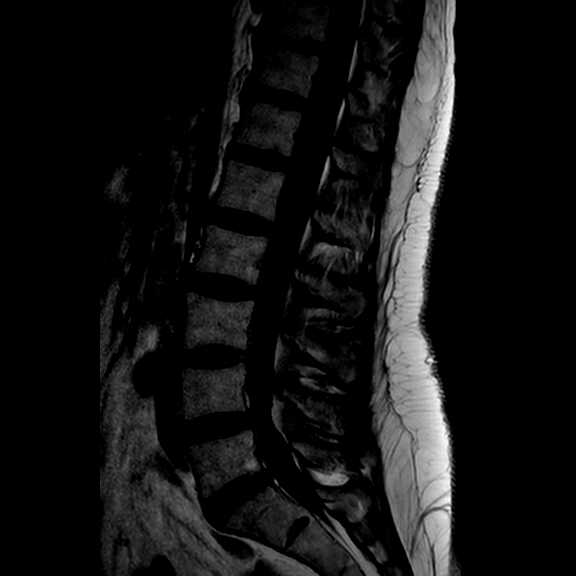
[im 13/17]
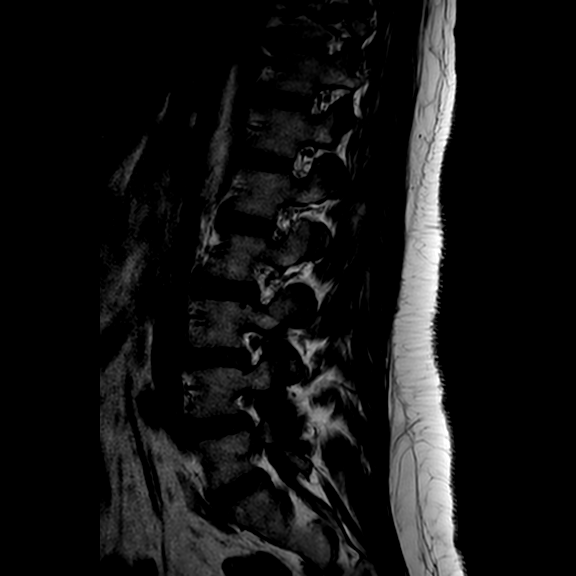
[im 17/17]
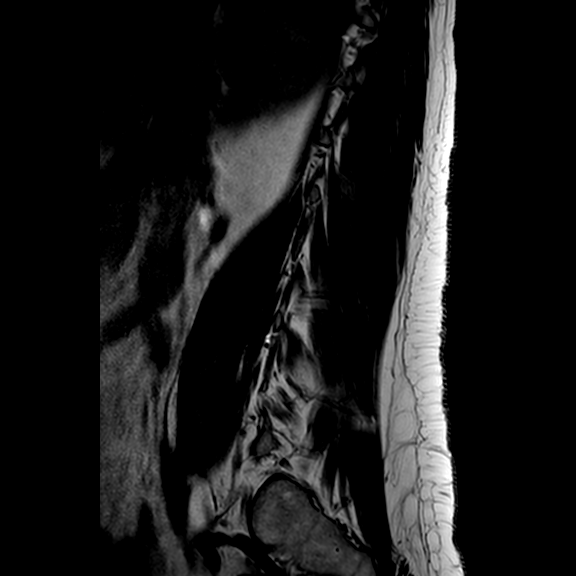

[Series 701: T1 · axial · 4.0mm · 0.38mm/px · z∈[-156,+47]mm · 9 of 35 slices shown]
[im 1/35]
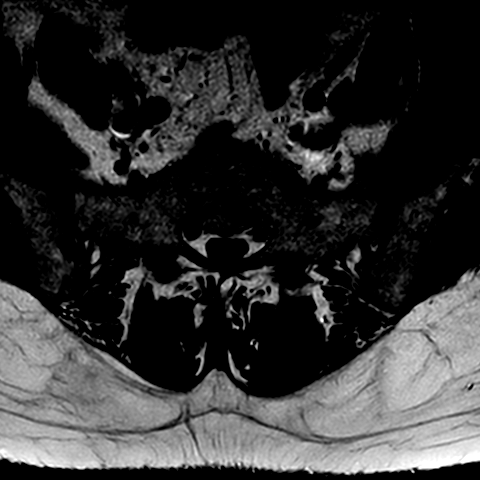
[im 6/35]
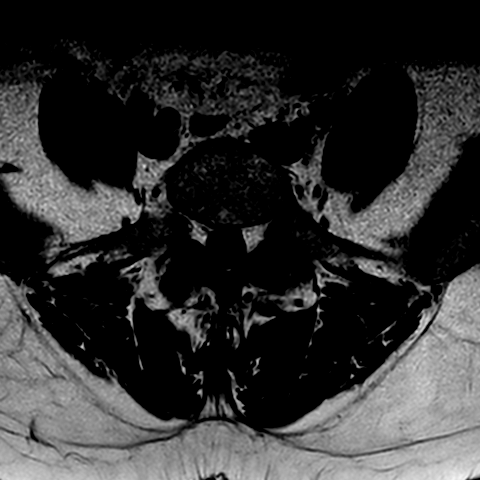
[im 12/35]
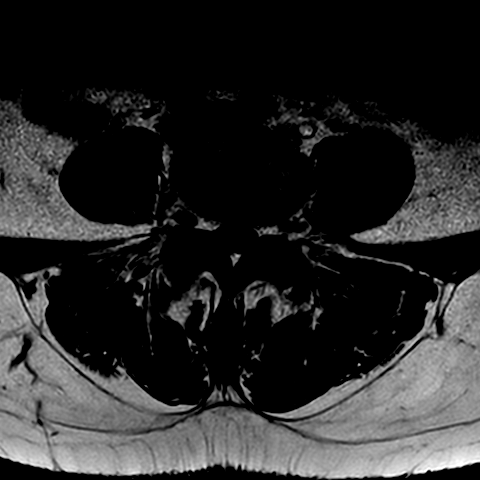
[im 15/35]
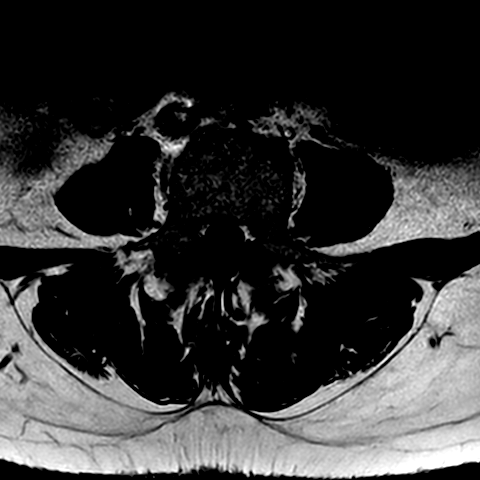
[im 18/35]
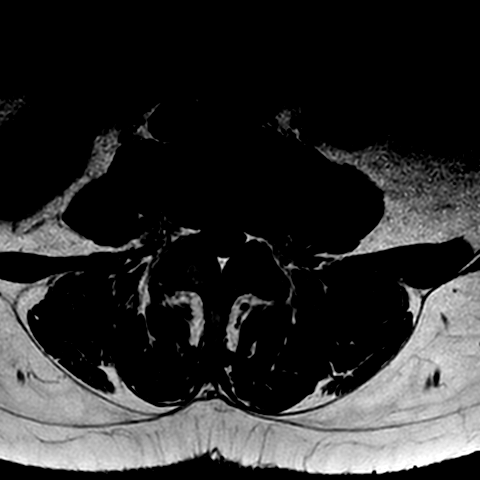
[im 20/35]
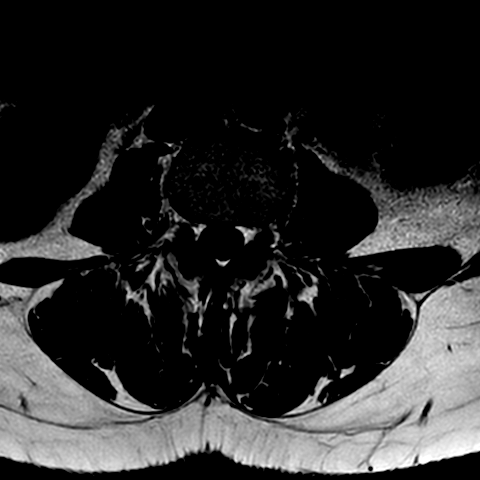
[im 23/35]
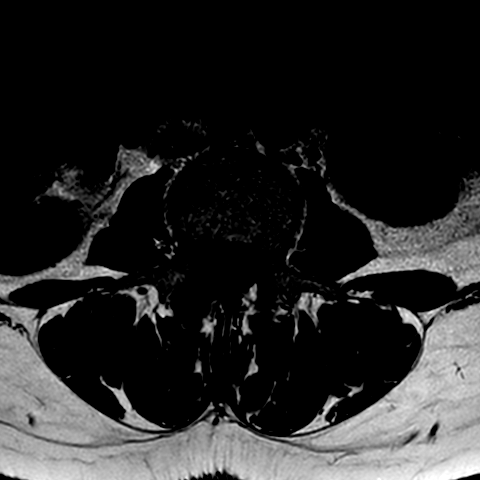
[im 29/35]
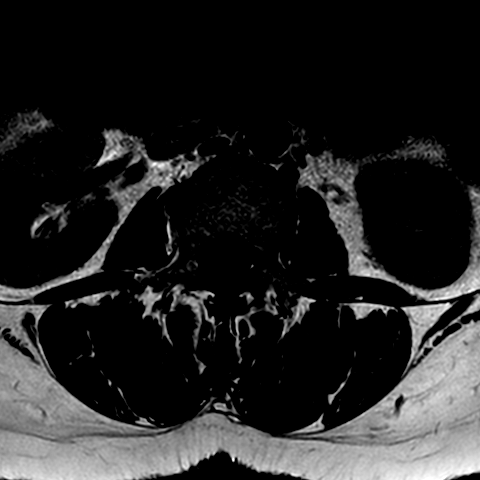
[im 35/35]
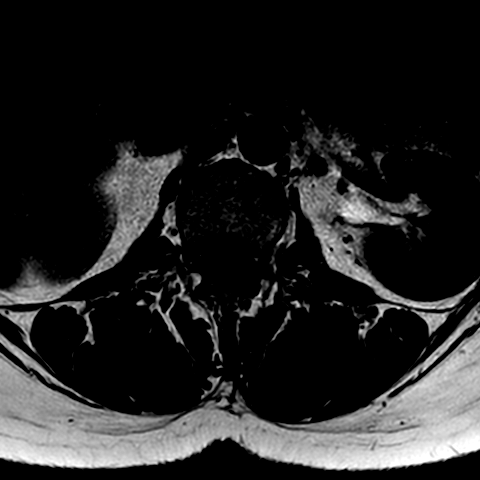

[21 of 48 positions shown; findings below may reference images not displayed]

FINDINGS: No fracture or subluxation in the lumbar spine. The conus terminates 
at the L1 level and has normal signal intensity. Disc spaces are preserved with 
normal T2 signal and normal disc space height. 
T12-L1: No canal stenosis, no disc protrusion and no lateral recess stenosis. 
Facet joints are preserved. 
L1-L2: No canal stenosis, no disc protrusion and no lateral recess stenosis. 
Facet joints are preserved. 
L2-L3:No canal stenosis, no disc protrusion and no lateral recess stenosis. Mild 
degenerative facet changes bilaterally slightly greater on the right than left. 
L3-L4:No canal stenosis, no disc protrusion and no lateral recess stenosis. 
Facet joints are preserved. 
L4-L5:Ligamentum flavum hypertrophy.. Mild degenerative facet changes 
bilaterally greater on the left and right. This causes mild right lateral recess 
stenosis and moderate left lateral recess stenosis as well as mild canal 
stenosis. 
L5-S1:No canal stenosis, no disc protrusion and no lateral recess stenosis. 
Facet joints are preserved. Mild degenerative facet changes bilaterally greater 
on the left and right.
IMPRESSION: At L4-5 there is ligamentum flavum hypertrophy. Mild degenerative facet changes 
bilaterally greater on the left and right. This causes mild right lateral recess 
stenosis and moderate left lateral recess stenosis as well as mild canal 
stenosis. 
Mild degenerative facet changes bilaterally at L4-5 and L5-S1 greater on the 
left than right. 
Mild degenerative facet changes bilaterally at L2-3 greater on the right than 
left. 
No fracture or subluxation in the lumbar spine. Disc space heights and T2 signal 
in disc spaces are preserved. No disc protrusion.

## 2020-07-30 IMAGING — MG MAMMOGRAPHY DIAGNOSTIC LEFT 3D TOMOSYNTHESIS WITH CAD
4 series · 4 of 12 positions shown · non-contrast
Comparison: None 
BREAST DENSITY: (Level C) The breasts are heterogeneously dense, which may 
obscure small masses.

MAMMOGRAPHY DIAGNOSTIC LEFT 3D TOMOSYNTHESIS WITH CAD, 07/30/2020 [DATE]: 
CLINICAL INDICATION: MRI guided left breast biopsy
TECHNIQUE: Unilateral left breast oblique medial lateral and craniocaudal full 
field digital mammogram and 3-D Tomosynthesis were obtained. In addition, 
computer-aided detection was utilized.

[L CC]
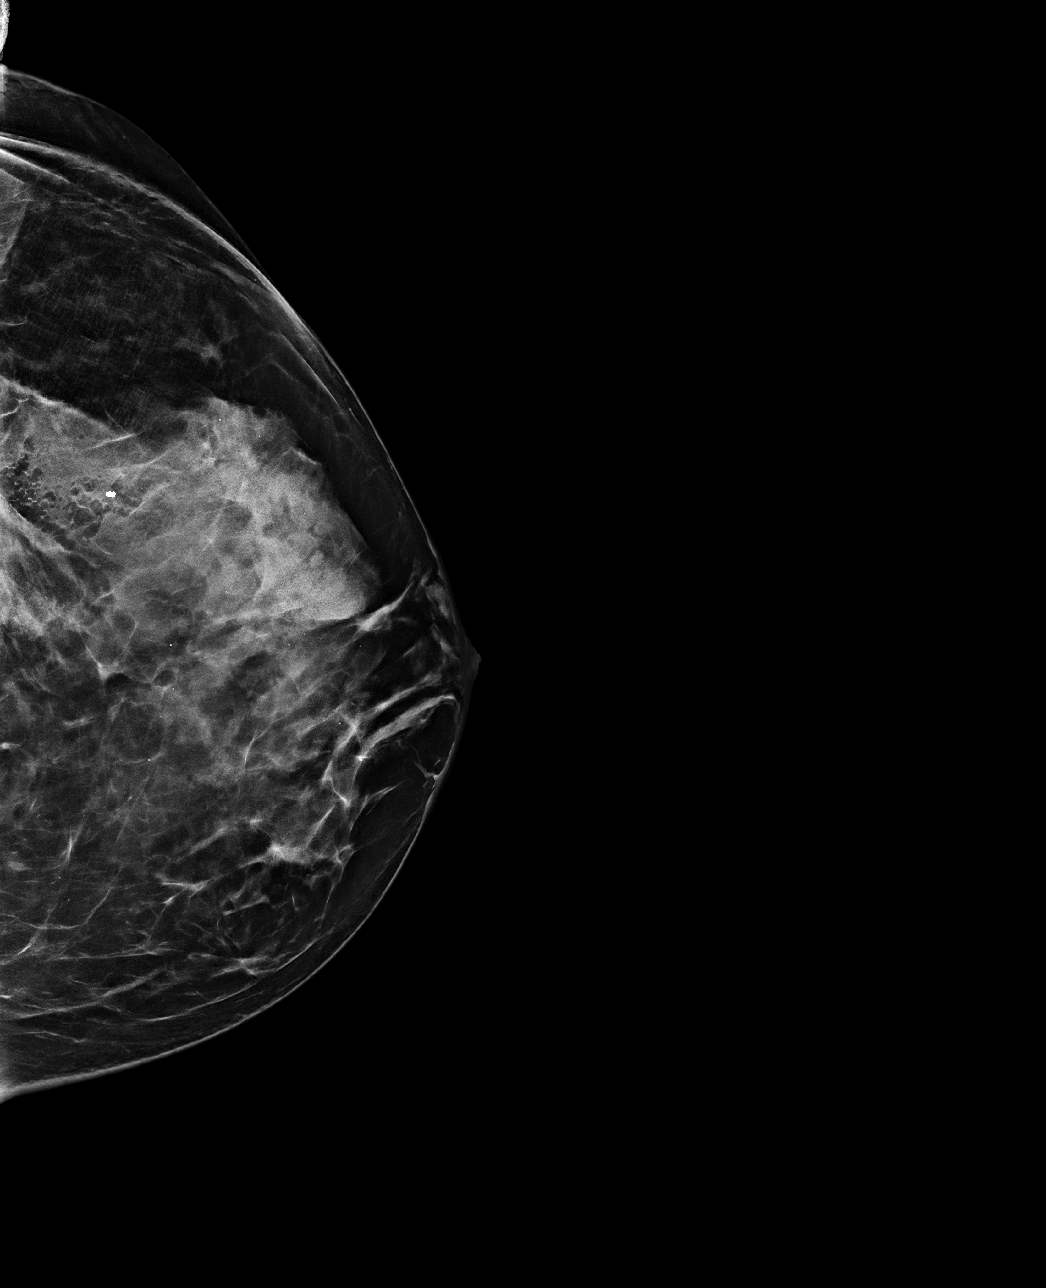

[L ML]
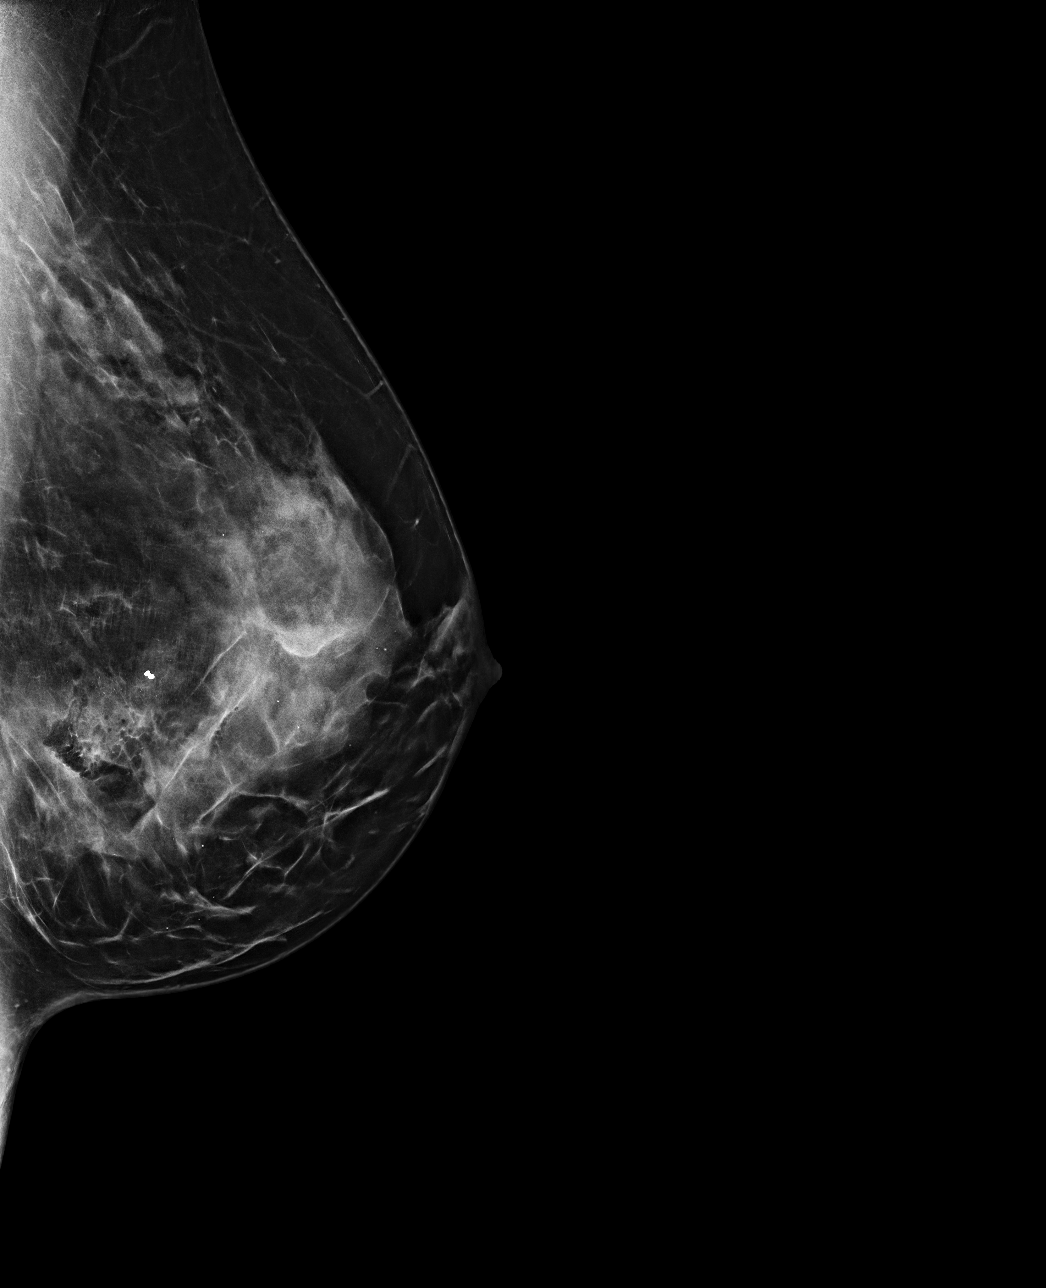

[L CC tomo · tomo slice 43/85.0]
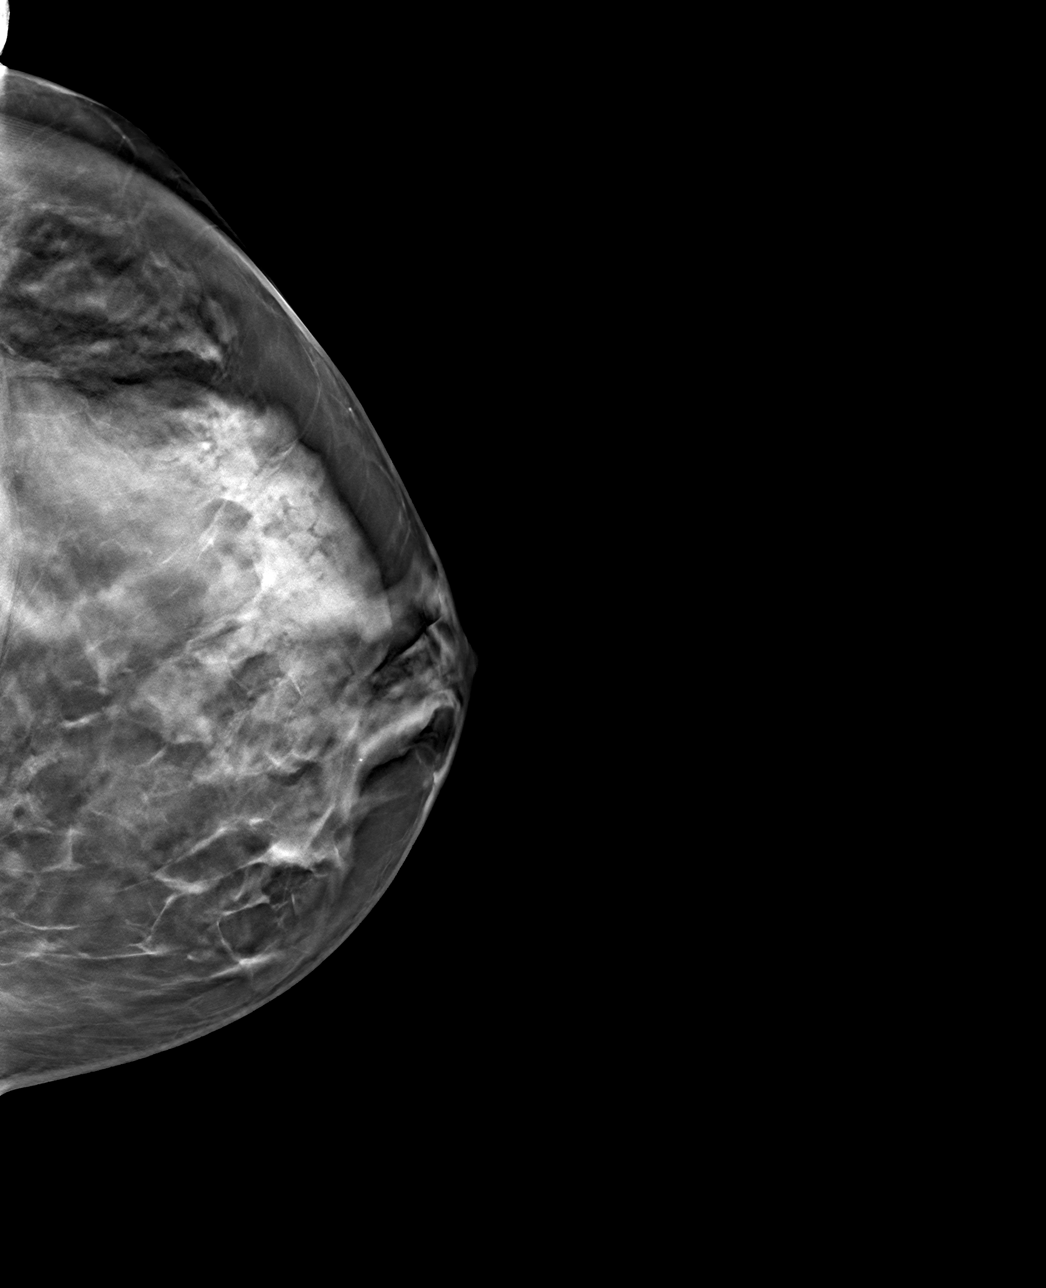

[L ML tomo · tomo slice 49/97.0]
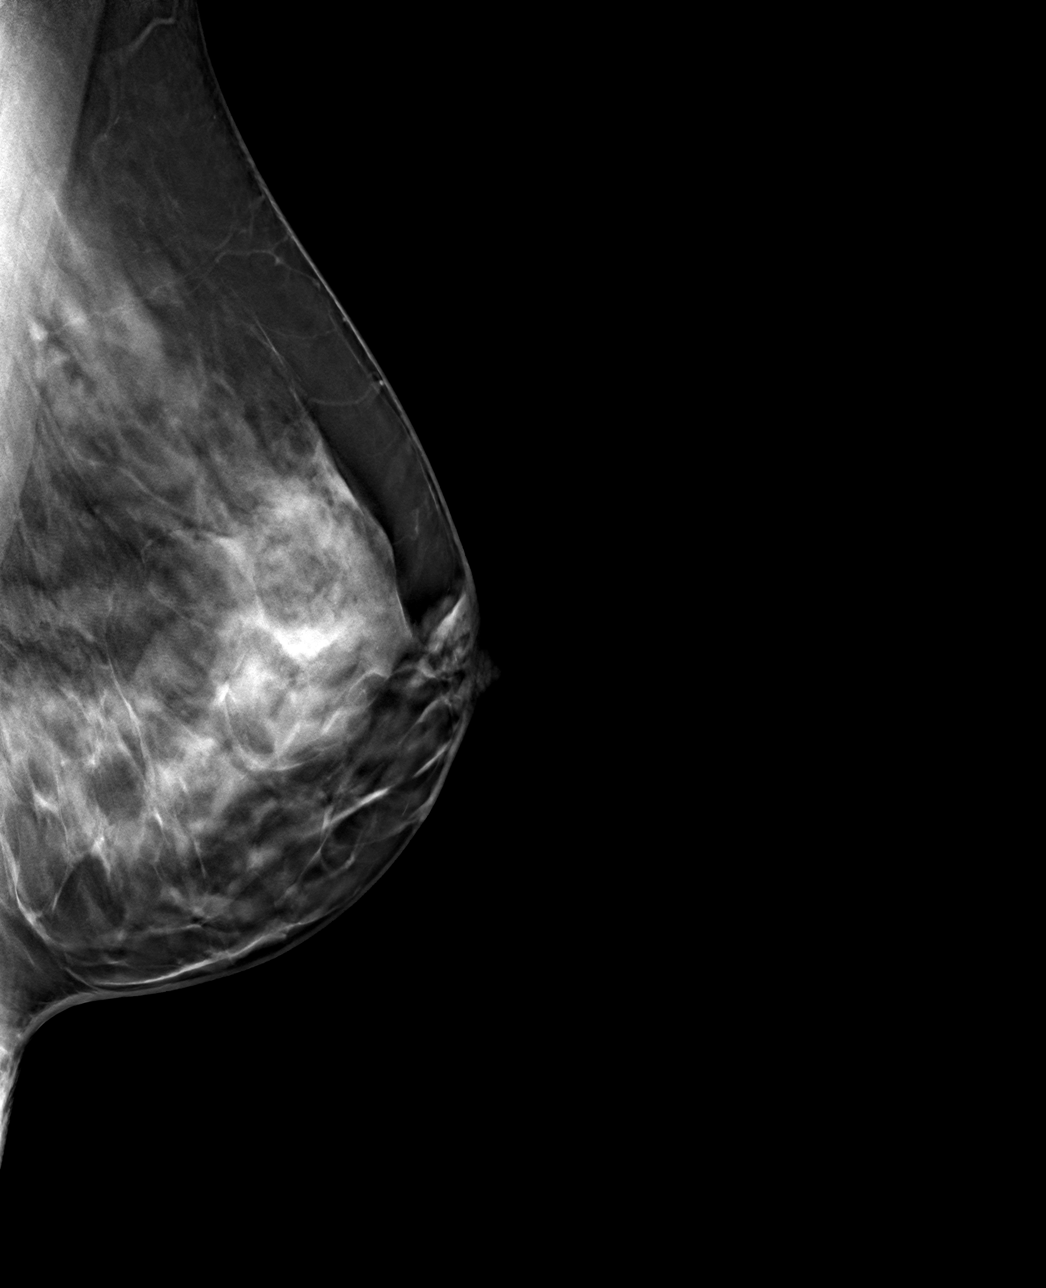

[4 of 12 positions shown; findings below may reference images not displayed]

FINDINGS: Hourglass clip seen laterally on the left. Correlates area recent 
biopsy.
IMPRESSION: Status post left breast MRI guided biopsy.

## 2020-07-30 IMAGING — MR MRI BREAST BIOPSY
4 of 11 series · 12 of 48 positions shown · non-contrast
Comparison: none

******** ADDENDUM #1 ********/n 
Addendum: 
Radiology pathology correlation.

[Series 201: survey · axial · 7.0mm · 1.34mm/px · z∈[-5,+254]mm · 2 of 25 slices shown]
[im 1/25]
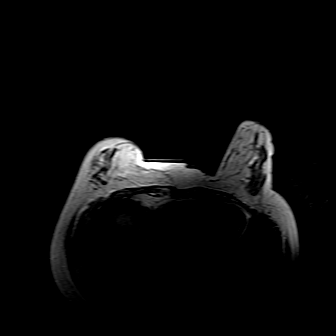
[im 25/25]
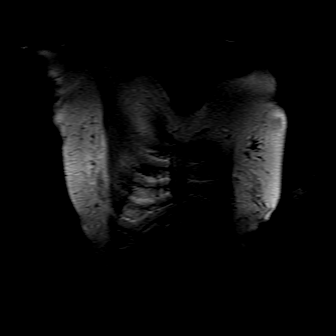

[Series 301: ml_marker detect.2mm · sagittal · 2.0mm · 0.97mm/px · 1 of 50 slices shown]
[im 1/50]
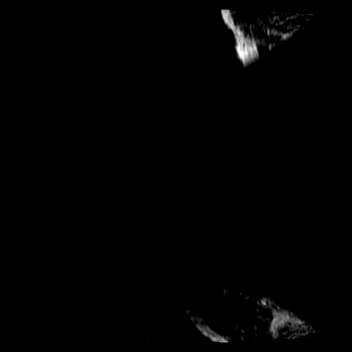

[Series 402: ssub post 1 · axial · 1.8mm · 0.51mm/px · z∈[-59,+136]mm · 5 of 218 slices shown]
[im 1/218]
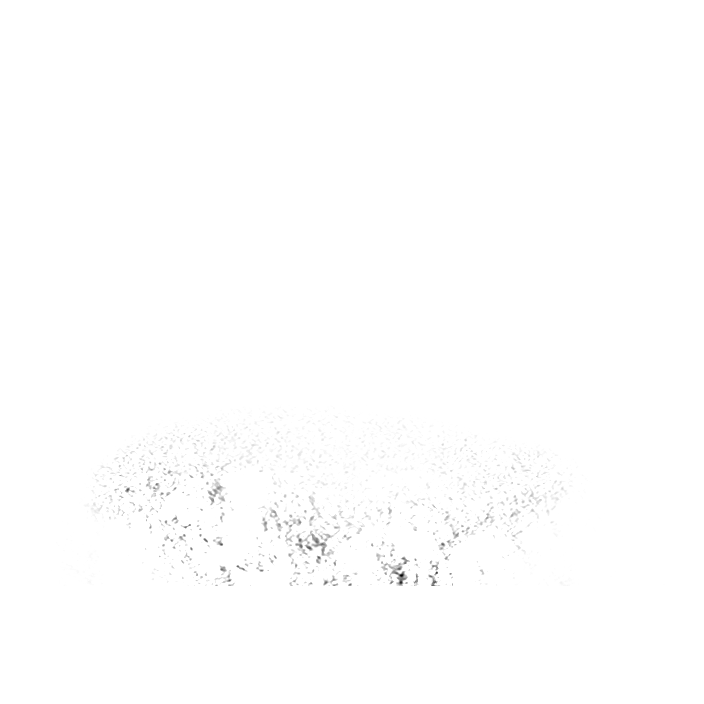
[im 55/218]
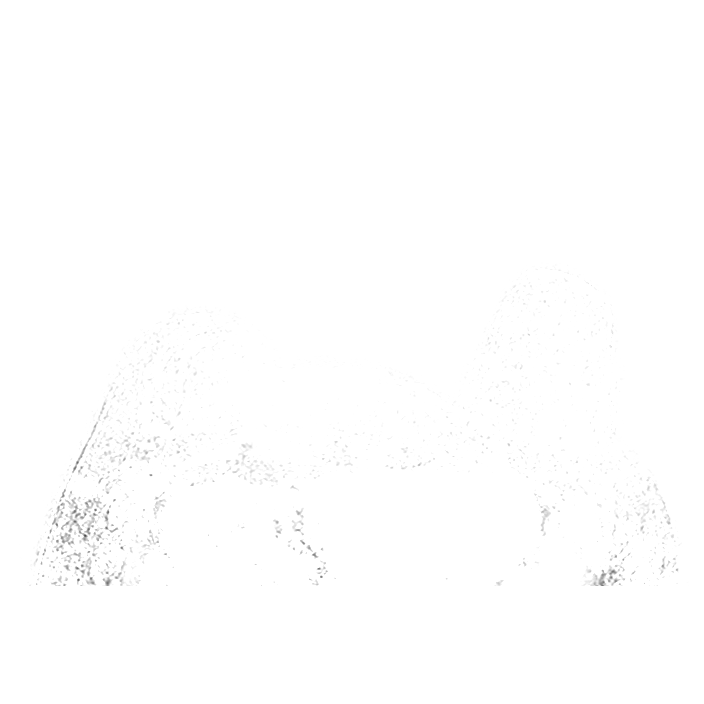
[im 109/218]
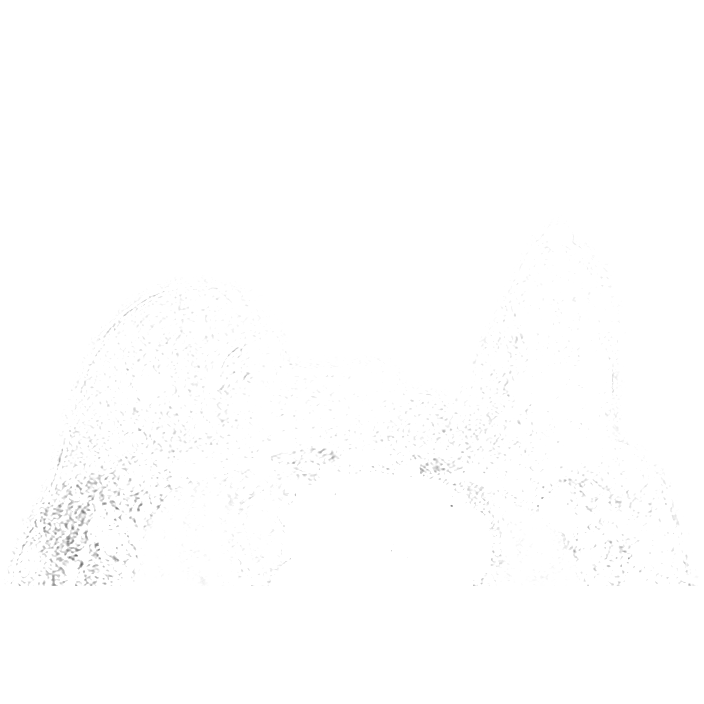
[im 163/218]
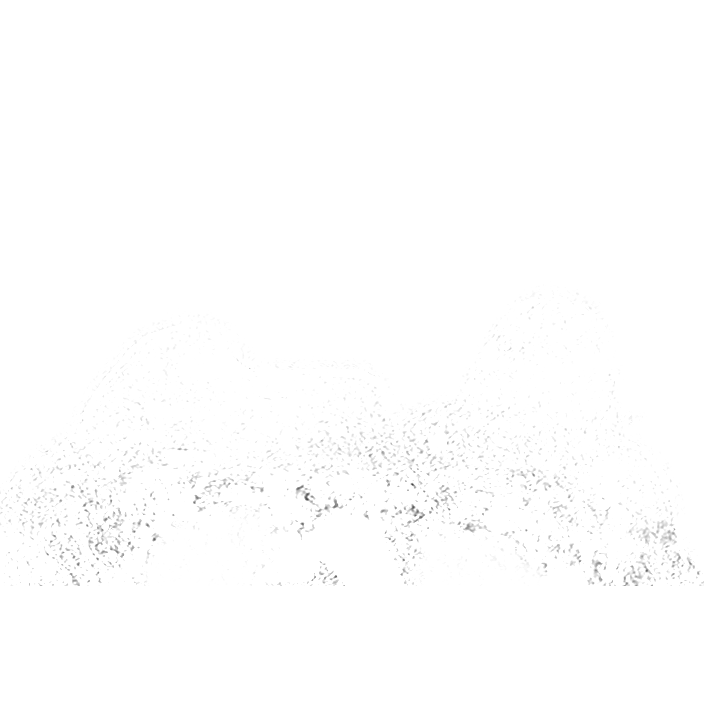
[im 218/218]
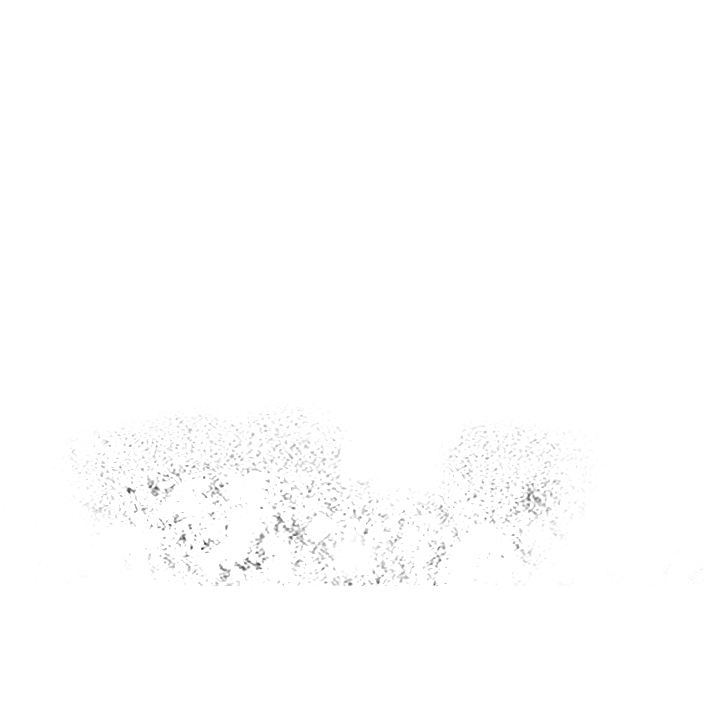

[Series 403: ssub post 2 · axial · 1.8mm · 0.51mm/px · z∈[-59,+136]mm · 4 of 218 slices shown]
[im 1/218]
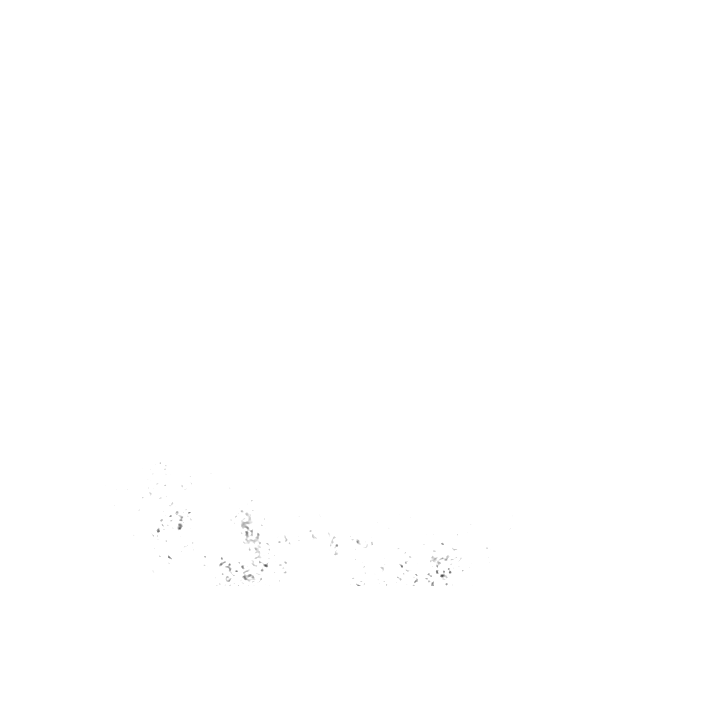
[im 55/218]
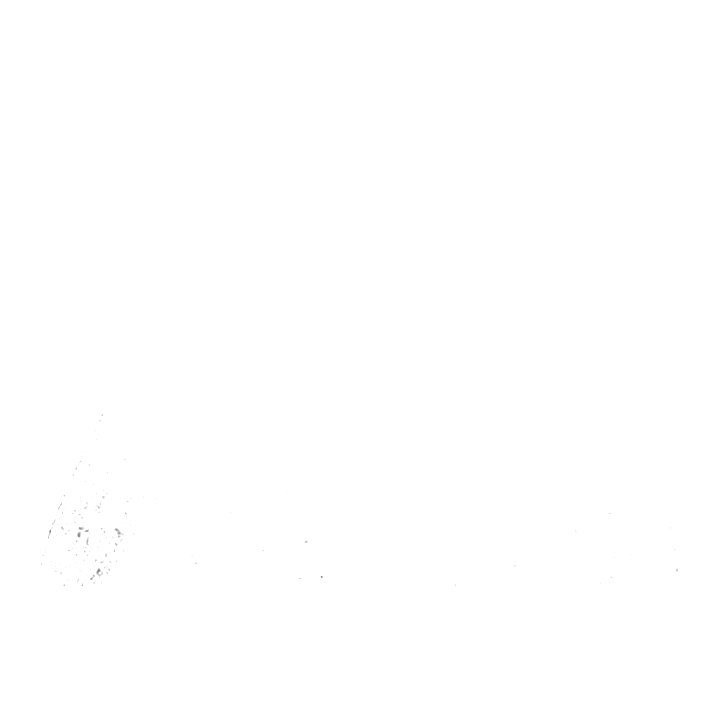
[im 109/218]
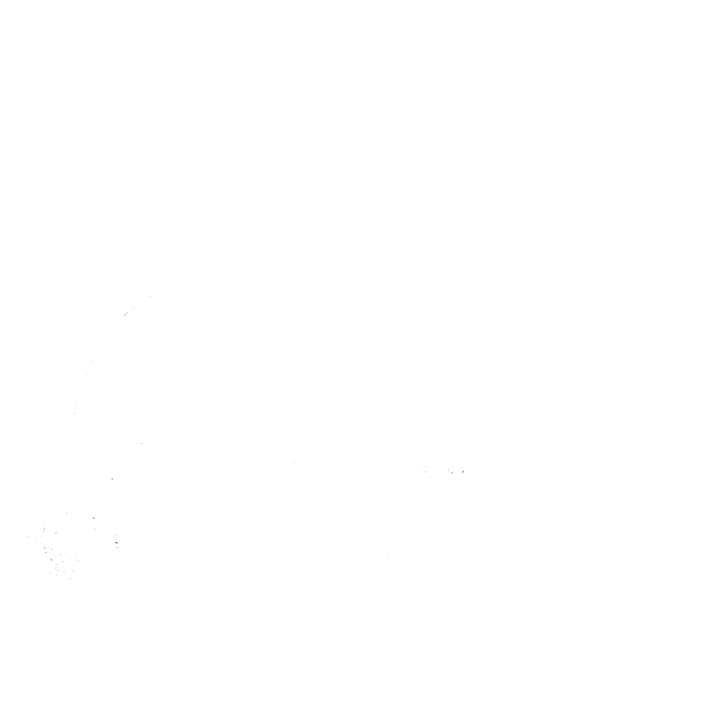
[im 218/218]
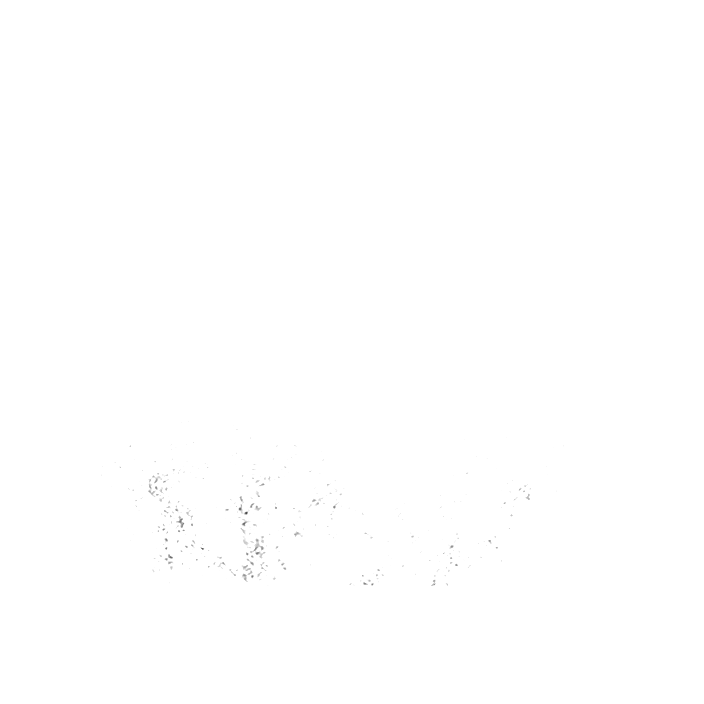

[12 of 48 positions shown; findings below may reference images not displayed]

FINDINGS: Outside facility breast MRI dated 07/16/2020 demonstrated non-mass enhancement 
involving the upper outer quadrant of the left breast for which biopsy was 
recommended. 
Resultant pathology demonstrated benign breast tissue with pseudoangiomatous 
stromal hyperplasia, usual hyperplasia without atypia, fibrocystic change, and 
apocrine change. No atypia or malignancy was identified. 
The enhancement present on MRI can be explained by the pathological results of 
pseudoangiomatosis stromal hyperplasia, usual hyperplasia, fibrocystic change, 
and apocrine change.
IMPRESSION: Concordant radiological and pathological results for MRI guided left breast 
biopsy performed on 07/30/2020. 
Recommendation: Surgical management of contralateral malignancy is recommended. 
ORIGINAL REPORT ******** 
MRI BREAST BIOPSY, 07/30/2020 [DATE]: 
PROCEDURE: Informed consent was obtained from the patient. The patient was 
placed on the MRI table in the usual position and images were obtained without 
and with 7.3 cc of Gadavist. The area of concern was easily identified. 
Using the DynaCAD and Suros ATEC needle biopsy device the area was localized. 
The overlying skin was prepped and draped in usual manner. After the 
administration of lidocaine locally, the Suros ATEC needle and catheter were 
inserted into the lesion. Position of the catheter was confirmed and 6 vacuum 
assisted core biopsies were obtained without complication. The patient tolerated 
the procedure well. A stereotactic clip was left in place. Hourglass in shape. 
Postprocedure images did not reveal evidence of significant complication.
IMPRESSION: Status post successful MRI guided biopsy of the area of concern within the left 
breast. No evidence to suggest complication.

## 2022-03-19 IMAGING — MR MRI BREAST BILATERAL W/WO CONTRAST
4 of 12 series · 12 of 48 positions shown · IV contrast (gadolinium)
Comparison: MRI exam of 03/05/2021. MR exams dating back to 07/16/2020

________________________________________________________________________________________________ 
MRI BREAST BILATERAL W/WO CONTRAST, 03/19/2022 [DATE]: 
CLINICAL INDICATION: Breast cancer, surveillance. History of right breast 
lumpectomy and radiation therapy. History of bilateral breast reduction.
TECHNIQUE: Multiple sequences were obtained in various planes with both before 
and after the intravenous administration of gadolinium. In addition to the 
routine images, three-dimensional renderings were performed on an independent 
workstation, time activity curves generated over areas of enhancement and 
computer-aided detection utilized. 8.5 mL of Gadavist were injected 
intravenously. 1.5 mL of Gadavist were discarded.  Patient was scanned on a 3T 
magnet.

[Series 201: survey · axial · 7.0mm · 1.34mm/px · 1 of 25 slices shown]
[im 1/25]
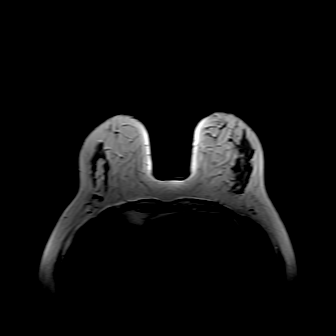

[Series 301: t1w_ffe_3d cs · axial · 1.6mm · 0.50mm/px · z∈[-88,+111]mm · 5 of 231 slices shown]
[im 1/231]
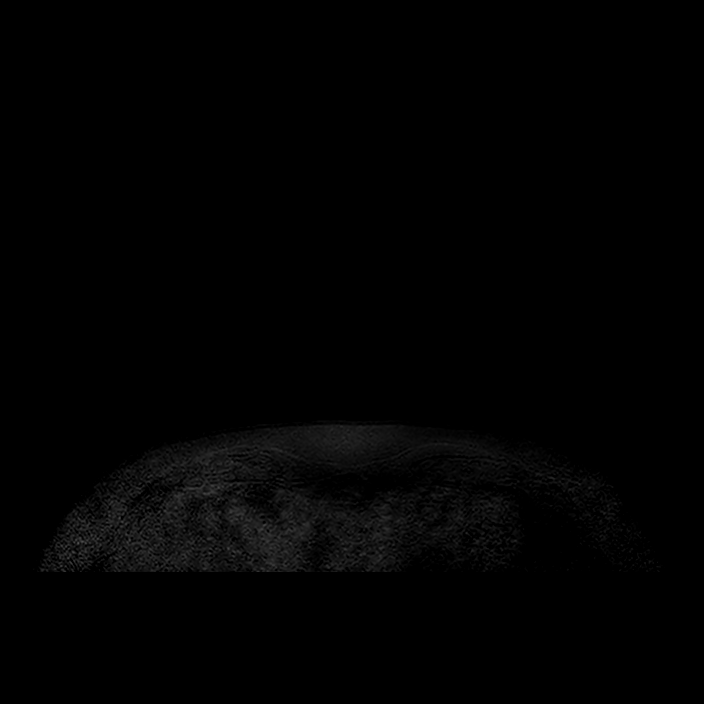
[im 58/231]
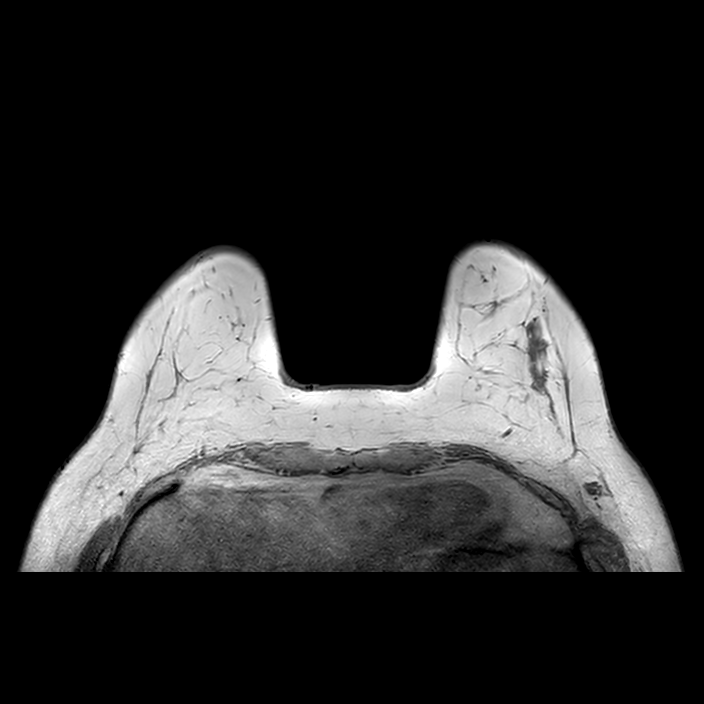
[im 116/231]
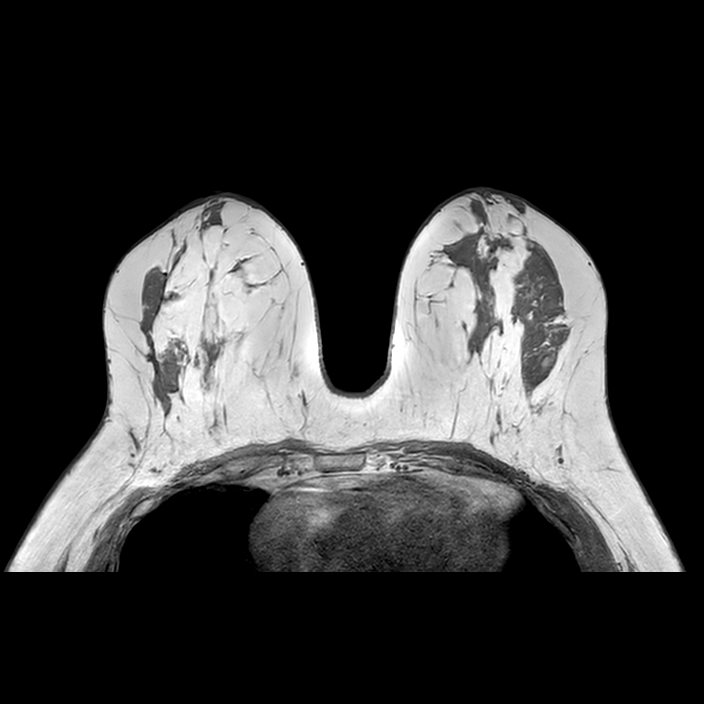
[im 173/231]
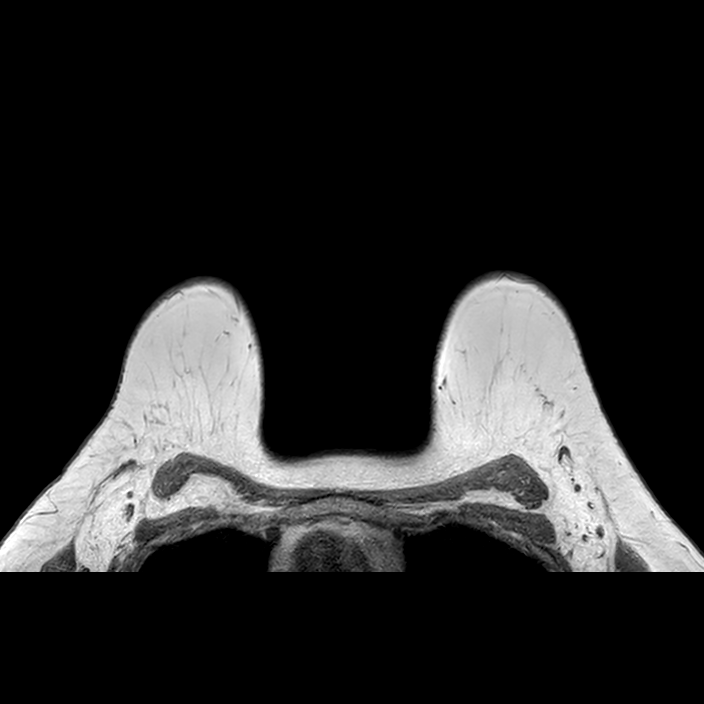
[im 231/231]
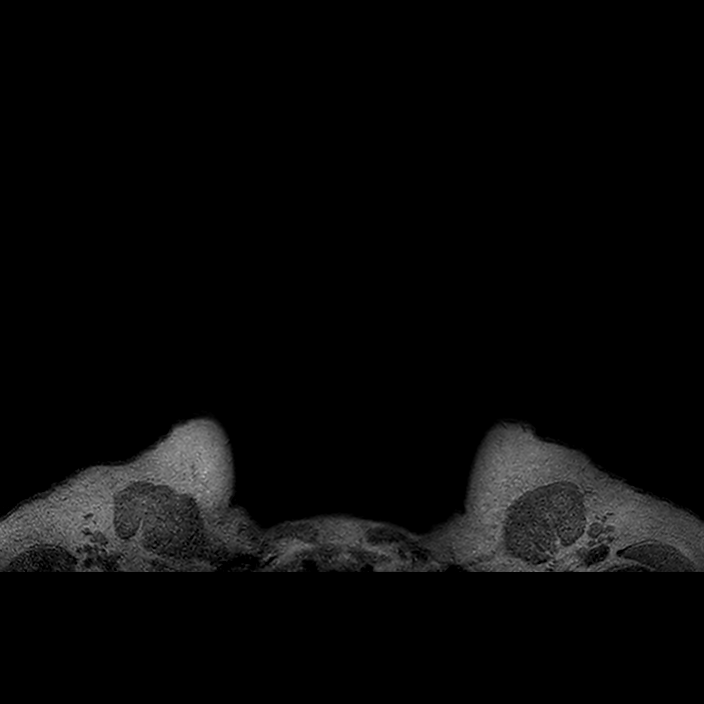

[Series 401: t2w_(id) · axial · 2.5mm · 0.63mm/px · z∈[-87,+110]mm · 2 of 80 slices shown]
[im 1/80]
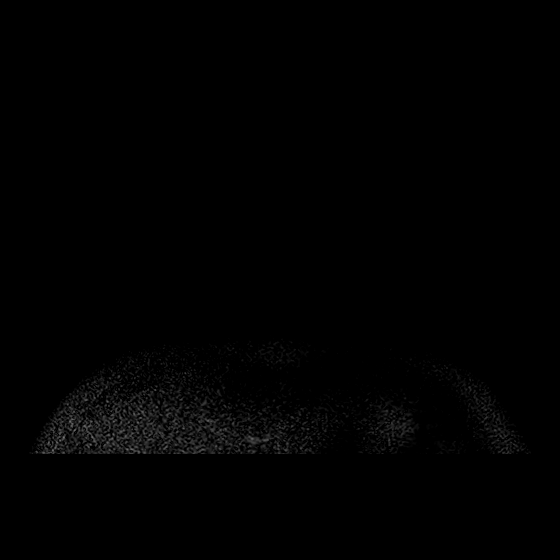
[im 80/80]
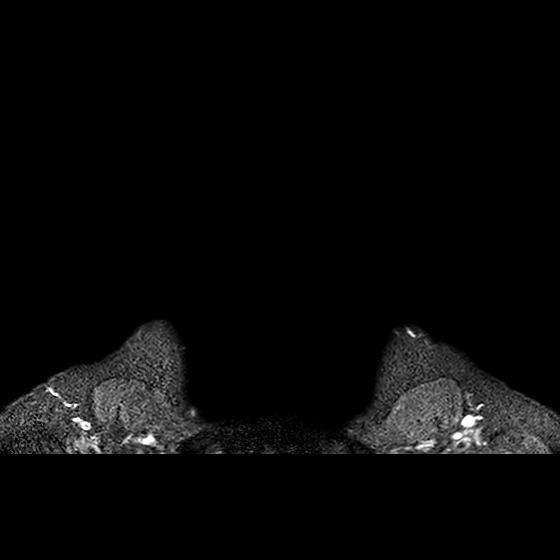

[Series 502: ssub dyn 1 · axial · 1.6mm · 0.49mm/px · z∈[-88,+111]mm · 4 of 250 slices shown]
[im 1/250]
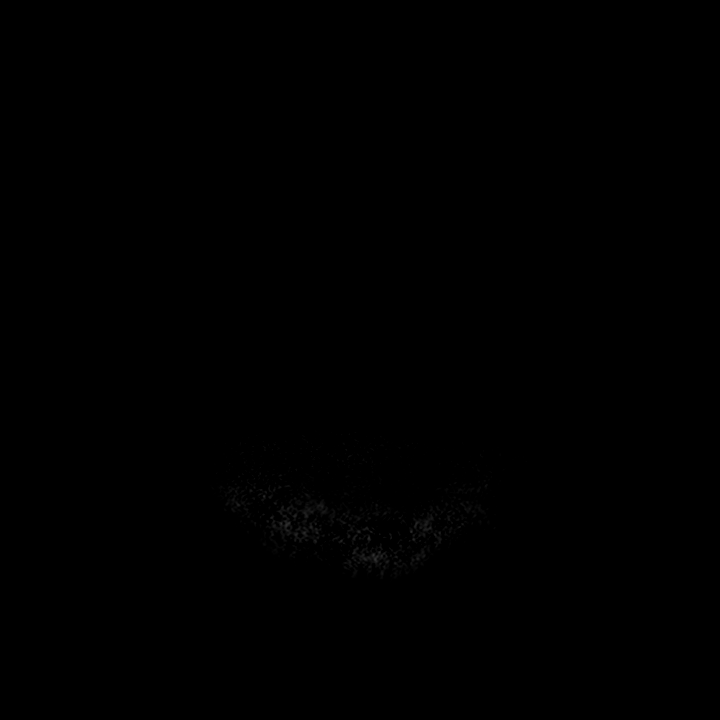
[im 63/250]
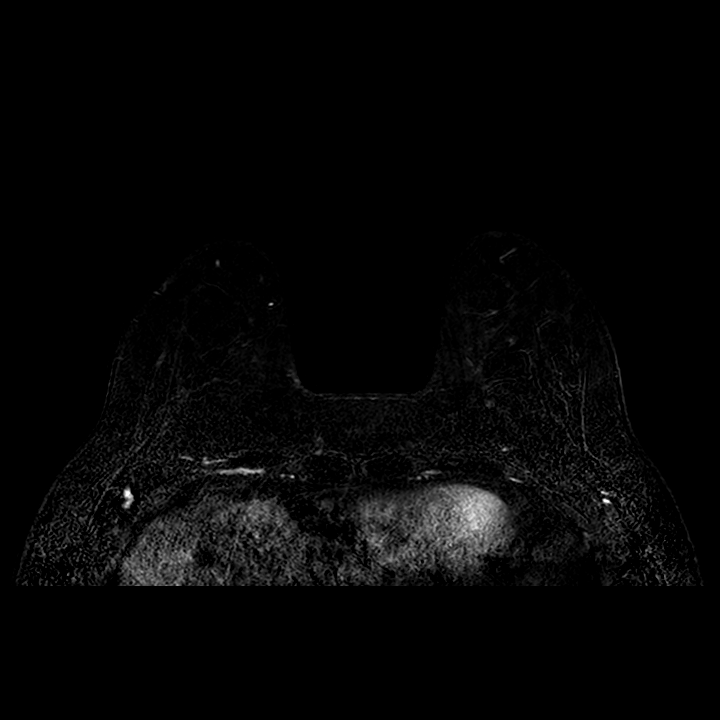
[im 125/250]
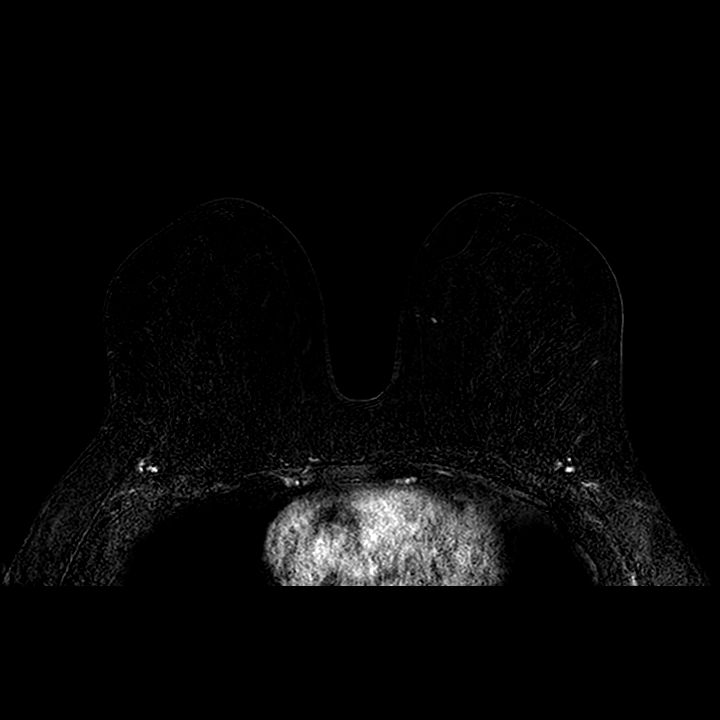
[im 250/250]
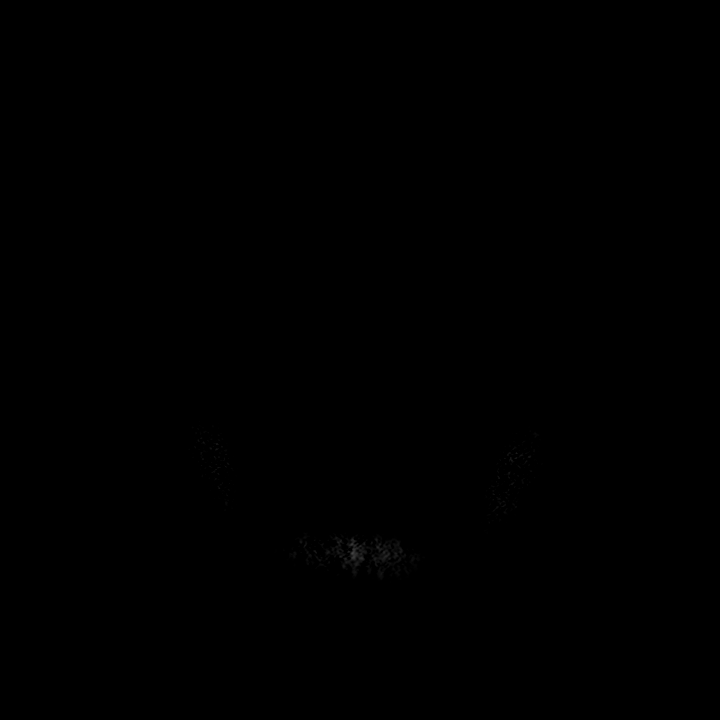

[12 of 48 positions shown; findings below may reference images not displayed]

FINDINGS: Minimal background parenchymal enhancement. 
Right breast: Posttreatment changes right breast. No abnormal areas of 
enhancement. No areas of enhancement meeting threshold criteria on CAD analysis. 
Small nonspecific right axillary lymph nodes.  No internal mammary nodes. 
Left breast: No abnormal areas of enhancement. No areas of enhancement meeting 
threshold criteria on CAD analysis. Small nonspecific left axillary lymph nodes. 
 No internal mammary nodes. 
Overall stable MR appearance.
IMPRESSION: No MR findings suggestive for malignancy. 
(BI-RADS 2) Benign findings. Routine mammographic follow-up is recommended.

## 2023-03-31 IMAGING — MR MRI BREAST BILATERAL W/WO CONTRAST
4 of 12 series · 11 of 48 positions shown · IV contrast (gadolinium)
Comparison: February 2022

________________________________________________________________________________________________ 
MRI BREAST BILATERAL W/WO CONTRAST, 03/31/2023 [DATE]: 
CLINICAL INDICATION: Right breast carcinoma. Follow-up. Surveillance. Previous 
breast reduction surgery. Right breast carcinoma initially diagnosed and treated 
in [id number].
TECHNIQUE: Multiple sequences were obtained in various planes with both before 
and after the intravenous administration of gadolinium. In addition to the 
routine images, three-dimensional renderings were performed on an independent 
workstation, time activity curves generated over areas of enhancement and 
computer-aided detection utilized. 7.5 mL of Gadavist were injected 
intravenously. Patient was scanned on a 3T magnet.

[Series 201: survey · axial · 7.0mm · 1.34mm/px · 1 of 25 slices shown]
[im 1/25]
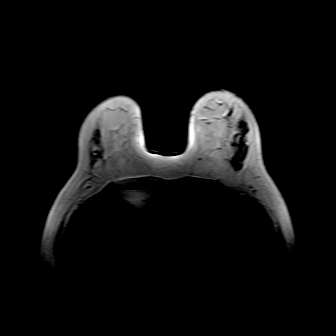

[Series 301: t1w_ffe_3d cs · axial · 1.6mm · 0.50mm/px · z∈[-50,+128]mm · 4 of 208 slices shown]
[im 1/208]
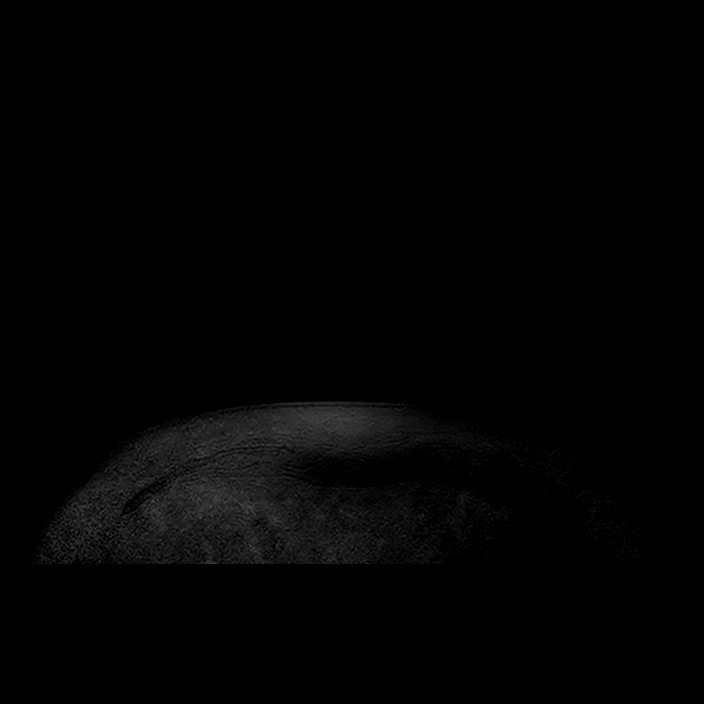
[im 70/208]
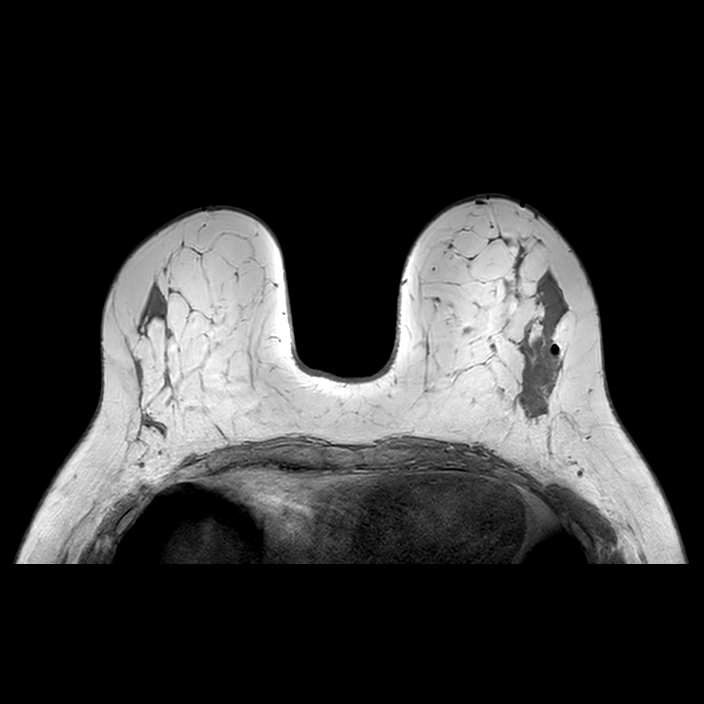
[im 139/208]
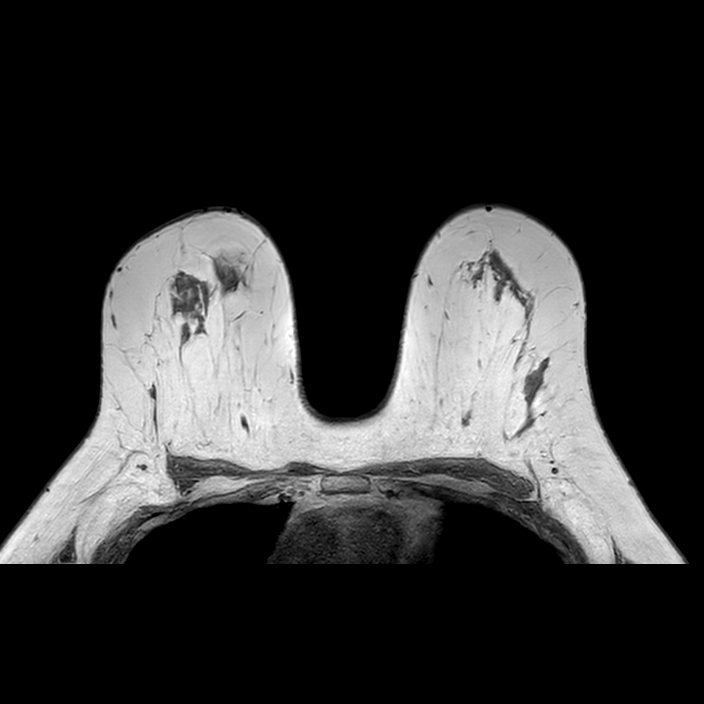
[im 208/208]
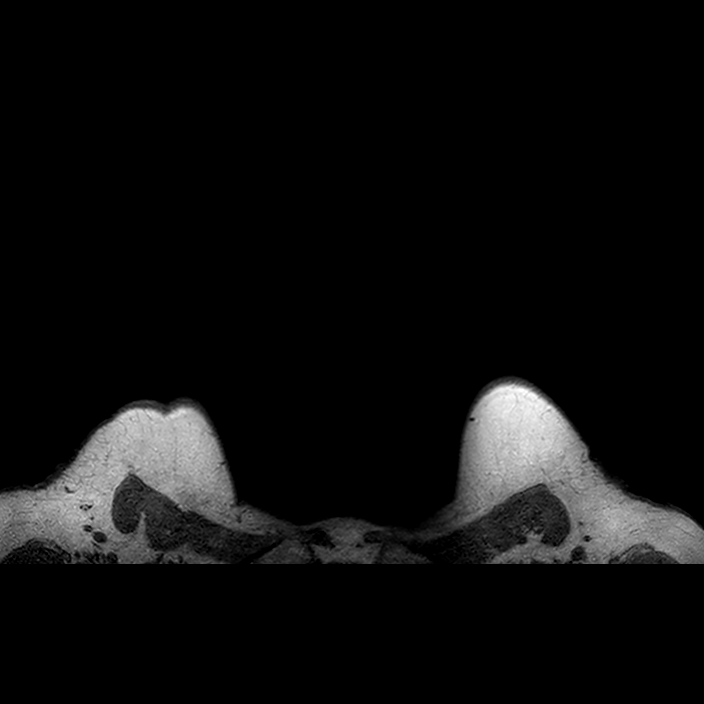

[Series 401: t2w_(id) · axial · 2.5mm · 0.62mm/px · z∈[-49,+148]mm · 2 of 80 slices shown]
[im 1/80]
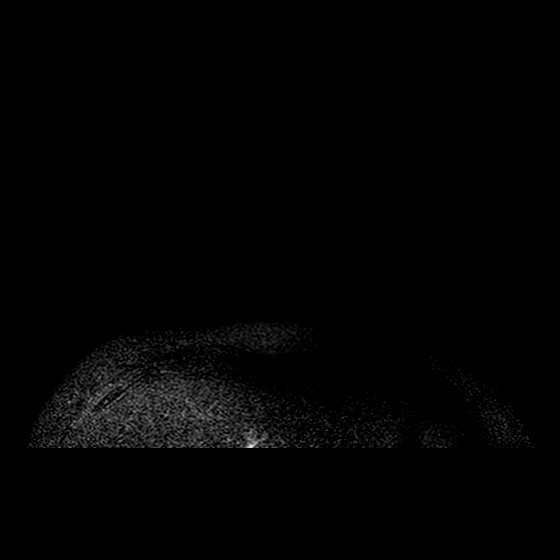
[im 80/80]
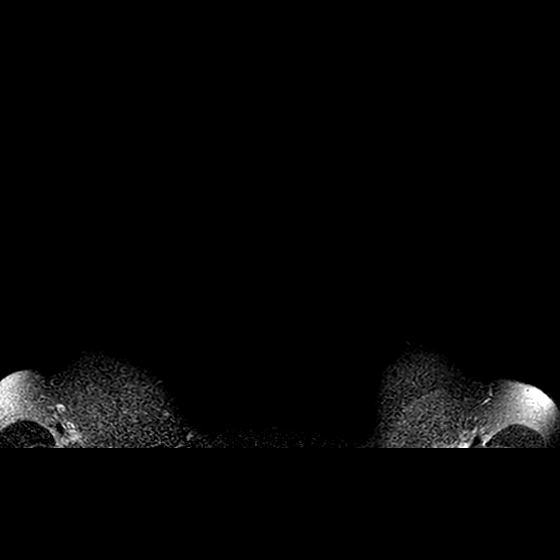

[Series 502: ssub dyn 1 · axial · 1.6mm · 0.44mm/px · z∈[-50,+149]mm · 4 of 250 slices shown]
[im 1/250]
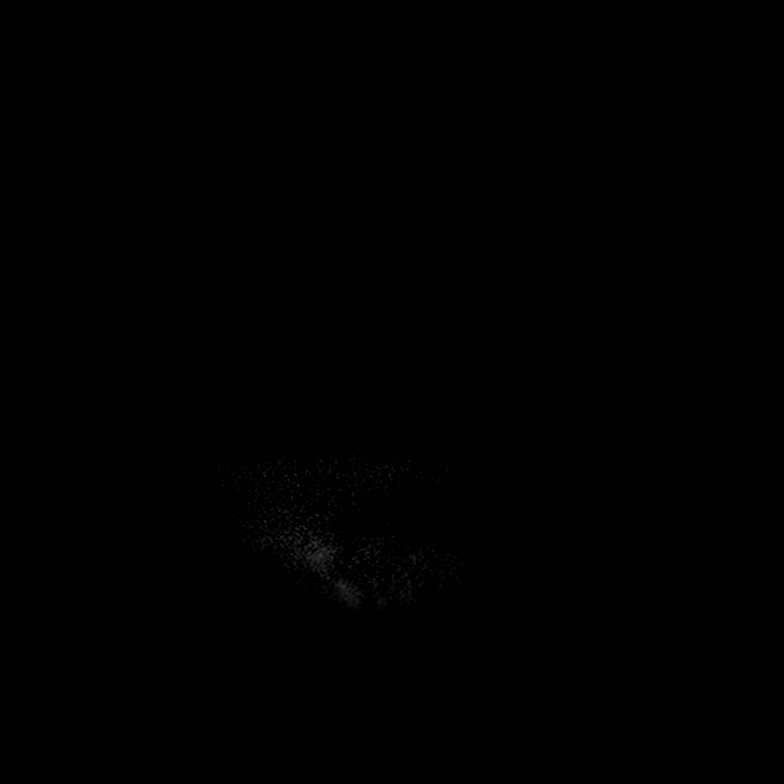
[im 63/250]
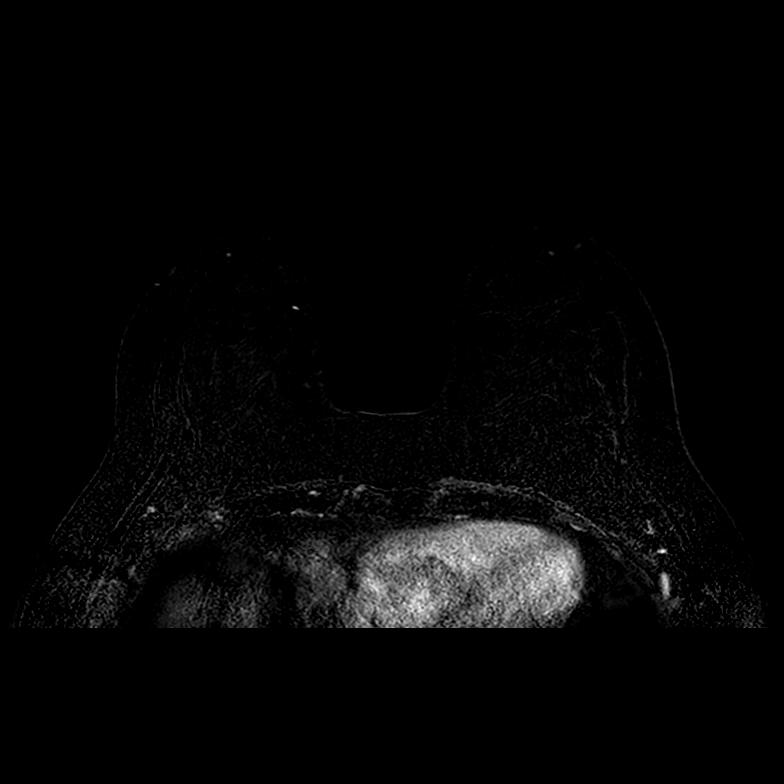
[im 125/250]
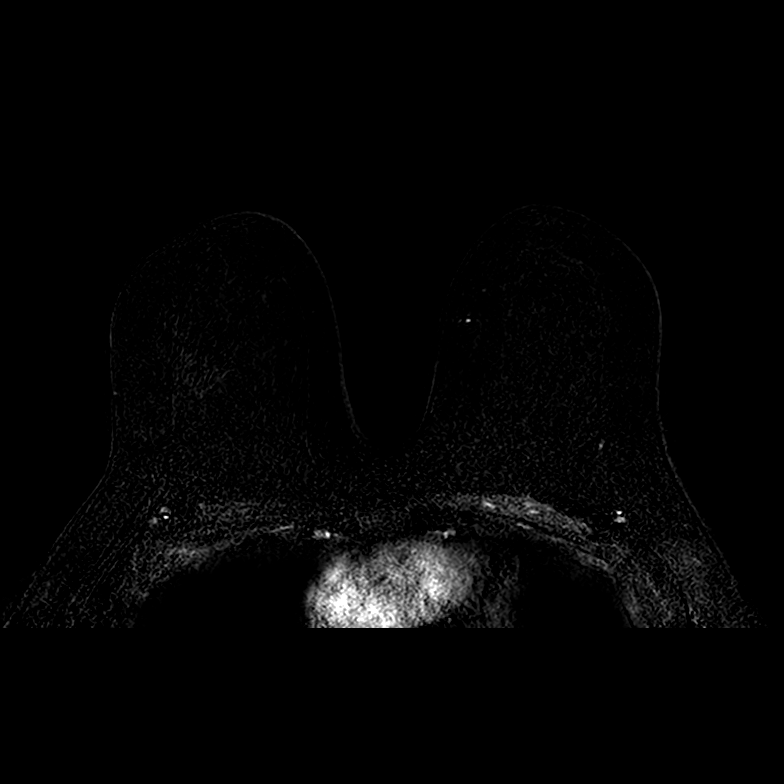
[im 250/250]
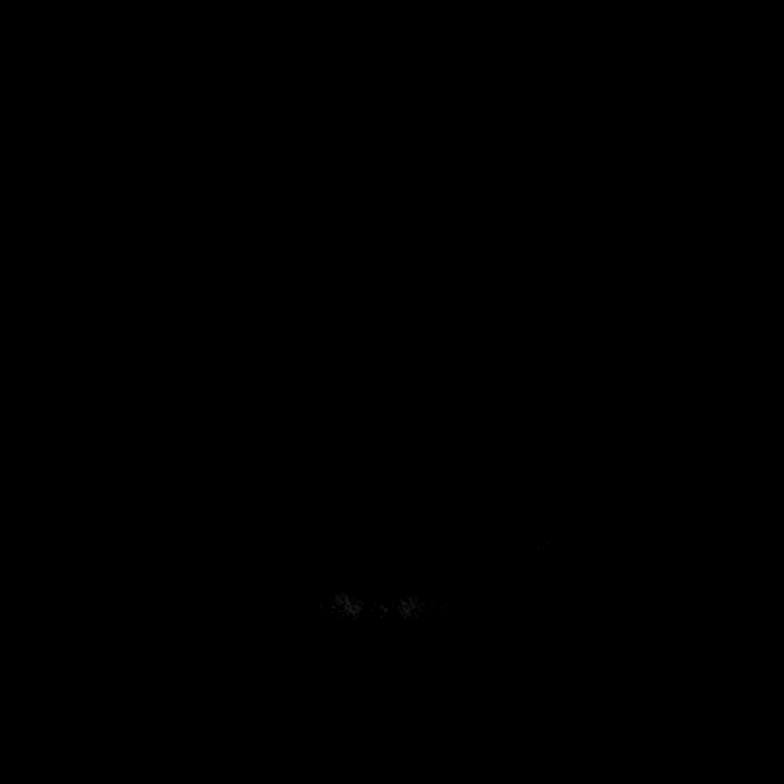

[11 of 48 positions shown; findings below may reference images not displayed]

FINDINGS: Minimal background parenchymal enhancement. 
Right breast: No abnormal areas of enhancement. No areas of enhancement meeting 
threshold criteria on CAD analysis. Small nonspecific right axillary lymph 
nodes.  No internal mammary nodes. 
Left breast: Stereotactic clip is seen on the left. Numerous cysts are seen. 
Natively bright T1 ducts are seen retroareolar on the left all similar to the 
prior examination. No new mass or adenopathy seen.
IMPRESSION: Stable exam. 
(BI-RADS 2) Benign findings. Routine mammographic follow-up is recommended.
